# Patient Record
Sex: Male | Born: 1990 | Race: Black or African American | Hispanic: No | Marital: Single | Smoking: Never smoker
Health system: Southern US, Community
[De-identification: ages and names within clinical notes are randomized; demographics above are authoritative.]

## PROBLEM LIST (undated history)

## (undated) DIAGNOSIS — T7840XA Allergy, unspecified, initial encounter: Secondary | ICD-10-CM

## (undated) DIAGNOSIS — D649 Anemia, unspecified: Secondary | ICD-10-CM

## (undated) HISTORY — PX: EYE SURGERY: SHX253

## (undated) HISTORY — DX: Anemia, unspecified: D64.9

## (undated) HISTORY — DX: Allergy, unspecified, initial encounter: T78.40XA

---
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
## 2009-01-25 ENCOUNTER — Emergency Department (HOSPITAL_COMMUNITY): Admission: EM | Admit: 2009-01-25 | Discharge: 2009-01-25 | Payer: Self-pay | Admitting: Emergency Medicine

## 2009-05-19 ENCOUNTER — Emergency Department (HOSPITAL_COMMUNITY): Admission: EM | Admit: 2009-05-19 | Discharge: 2009-05-19 | Payer: Self-pay | Admitting: Emergency Medicine

## 2011-11-15 ENCOUNTER — Encounter: Payer: Self-pay | Admitting: Emergency Medicine

## 2011-11-15 ENCOUNTER — Emergency Department (HOSPITAL_COMMUNITY): Payer: PRIVATE HEALTH INSURANCE

## 2011-11-15 ENCOUNTER — Emergency Department (HOSPITAL_COMMUNITY)
Admission: EM | Admit: 2011-11-15 | Discharge: 2011-11-15 | Disposition: A | Discharge status: Home / Self Care | Payer: PRIVATE HEALTH INSURANCE | Attending: Emergency Medicine | Admitting: Emergency Medicine

## 2011-11-15 DIAGNOSIS — S63602A Unspecified sprain of left thumb, initial encounter: Secondary | ICD-10-CM

## 2011-11-15 DIAGNOSIS — M79609 Pain in unspecified limb: Secondary | ICD-10-CM | POA: Insufficient documentation

## 2011-11-15 DIAGNOSIS — IMO0002 Reserved for concepts with insufficient information to code with codable children: Secondary | ICD-10-CM | POA: Insufficient documentation

## 2011-11-15 DIAGNOSIS — S6390XA Sprain of unspecified part of unspecified wrist and hand, initial encounter: Secondary | ICD-10-CM | POA: Insufficient documentation

## 2011-11-15 MED ORDER — IBUPROFEN 800 MG PO TABS: 800.0000 mg | ORAL_TABLET | Freq: Three times a day (TID) | ORAL | Status: AC | PRN

## 2011-11-15 NOTE — ED Notes (Signed)
Ortho tech at bedside 

## 2011-11-15 NOTE — ED Provider Notes (Signed)
History     CSN: [id number] Arrival date & time: 11/15/2011 12:47 PM   First MD Initiated Contact with Patient 11/15/11 1457      Chief Complaint  Patient presents with  . Hand Pain  . Joint Swelling    (Consider location/radiation/quality/duration/timing/severity/associated sxs/prior treatment) HPI Comments: L thumb bent backwards by another person one hour PTA. Pt complains of pain, swelling at base of thumb, and difficulty bending thumb. Pt took 400mg  ibuprofen PTA. Denies other injury.   Patient is a 20 y.o. male presenting with hand pain. The history is provided by the patient.  Hand Pain This is a new problem. The current episode started today. Associated symptoms include joint swelling. Pertinent negatives include no arthralgias, neck pain, numbness or weakness. The symptoms are aggravated by bending. He has tried NSAIDs for the symptoms. The treatment provided mild relief.    History reviewed. No pertinent past medical history.  History reviewed. No pertinent past surgical history.  No family history on file.  History  Substance Use Topics  . Smoking status: Never Smoker   . Smokeless tobacco: Not on file  . Alcohol Use: No      Review of Systems  Constitutional: Negative for activity change.  HENT: Negative for neck pain.   Musculoskeletal: Positive for joint swelling. Negative for back pain and arthralgias.  Skin: Negative for wound.  Neurological: Negative for weakness and numbness.    Allergies  Shrimp  Home Medications   Current Outpatient Rx  Name Route Sig Dispense Refill  . IBUPROFEN 200 MG PO TABS Oral Take 400 mg by mouth every 6 (six) hours as needed. pain       BP 111/67  Pulse 85  Temp(Src) 98.3 F (36.8 C) (Oral)  Resp 16  SpO2 98%  Physical Exam  Nursing note and vitals reviewed. Constitutional: He is oriented to person, place, and time. He appears well-developed and well-nourished.  HENT:  Head: Normocephalic and atraumatic.    Eyes: Conjunctivae are normal.  Neck: Normal range of motion. Neck supple.  Cardiovascular: Normal rate.   Musculoskeletal: He exhibits tenderness. He exhibits no edema.       Tenderness to palp over flexor aspect of L thumb. Decreased ROM with flexion and opposition of L thumb. No swelling noted. Cap refill < 2s. No wrist or elbow pain.   Neurological: He is alert and oriented to person, place, and time.  Skin: Skin is warm and dry.  Psychiatric: He has a normal mood and affect.    ED Course  Procedures (including critical care time)  Labs Reviewed - No data to display Dg Hand Complete Left  11/15/2011  *RADIOLOGY REPORT*  Clinical Data: Injury and pain.  LEFT HAND - COMPLETE 3+ VIEW  Comparison: None.  Findings: Imaged bones, joints and soft tissues appear normal.  IMPRESSION: Negative study.  Original Report Authenticated By: Bernadene Bell. D'ALESSIO, M.D.     1. Sprain of left thumb     3:07 PM Pt seen and examined. X-ray ordered.   3:28 PM X-ray reviewed by myself. Pt informed. Finger splint by ortho tech. Pt counseled on RICE. Ortho f/u given if no improvement. Pt verbalizes understanding and agrees with plan.   MDM  Thumb sprain.         Eustace Moore [place], [place] 11/15/11 [phone]

## 2011-11-15 NOTE — ED Provider Notes (Signed)
Medical screening examination/treatment/procedure(s) were performed by non-physician practitioner and as supervising physician I was immediately available for consultation/collaboration.   Forbes Cellar, MD 11/15/11 1538

## 2011-11-15 NOTE — ED Notes (Signed)
Pt states he had finger bent back accidentally, heard a pop and had immediate pain.

## 2011-11-15 NOTE — ED Notes (Signed)
Pt c/o left thumb pain beginning 20 mins pta with swelling; states "My girlfriend grabbed it and I felt a pop and it immediately began hurting and swelling. I cant hardly move it." No obvious deformity noted.

## 2011-11-15 NOTE — ED Notes (Signed)
Awaiting ortho arrival for splint application.

## 2015-03-21 ENCOUNTER — Encounter (HOSPITAL_COMMUNITY): Payer: Self-pay | Admitting: *Deleted

## 2015-03-21 ENCOUNTER — Emergency Department (HOSPITAL_COMMUNITY)
Admission: EM | Admit: 2015-03-21 | Discharge: 2015-03-21 | Disposition: A | Discharge status: Home / Self Care | Payer: BLUE CROSS/BLUE SHIELD | Attending: Emergency Medicine | Admitting: Emergency Medicine

## 2015-03-21 DIAGNOSIS — R000 Tachycardia, unspecified: Secondary | ICD-10-CM | POA: Diagnosis not present

## 2015-03-21 DIAGNOSIS — J020 Streptococcal pharyngitis: Secondary | ICD-10-CM | POA: Diagnosis not present

## 2015-03-21 DIAGNOSIS — R509 Fever, unspecified: Secondary | ICD-10-CM | POA: Diagnosis present

## 2015-03-21 LAB — URINALYSIS, ROUTINE W REFLEX MICROSCOPIC
Glucose, UA: NEGATIVE mg/dL
Hgb urine dipstick: NEGATIVE
Ketones, ur: 40 mg/dL — AB
Leukocytes, UA: NEGATIVE
Nitrite: NEGATIVE
Protein, ur: 30 mg/dL — AB
Specific Gravity, Urine: 1.031 — ABNORMAL HIGH (ref 1.005–1.030)
Urobilinogen, UA: 2 mg/dL — ABNORMAL HIGH (ref 0.0–1.0)
pH: 6 (ref 5.0–8.0)

## 2015-03-21 LAB — CBC WITH DIFFERENTIAL/PLATELET
Basophils Absolute: 0 10*3/uL (ref 0.0–0.1)
Basophils Relative: 0 % (ref 0–1)
Eosinophils Absolute: 0 10*3/uL (ref 0.0–0.7)
Eosinophils Relative: 0 % (ref 0–5)
HCT: 41.5 % (ref 39.0–52.0)
Hemoglobin: 13.6 g/dL (ref 13.0–17.0)
Lymphocytes Relative: 9 % — ABNORMAL LOW (ref 12–46)
Lymphs Abs: 1.2 10*3/uL (ref 0.7–4.0)
MCH: 23.7 pg — ABNORMAL LOW (ref 26.0–34.0)
MCHC: 32.8 g/dL (ref 30.0–36.0)
MCV: 72.4 fL — ABNORMAL LOW (ref 78.0–100.0)
Monocytes Absolute: 1.1 10*3/uL — ABNORMAL HIGH (ref 0.1–1.0)
Monocytes Relative: 8 % (ref 3–12)
Neutro Abs: 11.4 10*3/uL — ABNORMAL HIGH (ref 1.7–7.7)
Neutrophils Relative %: 83 % — ABNORMAL HIGH (ref 43–77)
Platelets: 200 10*3/uL (ref 150–400)
RBC: 5.73 MIL/uL (ref 4.22–5.81)
RDW: 13.9 % (ref 11.5–15.5)
WBC: 13.7 10*3/uL — ABNORMAL HIGH (ref 4.0–10.5)

## 2015-03-21 LAB — URINE MICROSCOPIC-ADD ON

## 2015-03-21 LAB — COMPREHENSIVE METABOLIC PANEL
ALT: 24 U/L (ref 0–53)
AST: 24 U/L (ref 0–37)
Albumin: 3.5 g/dL (ref 3.5–5.2)
Alkaline Phosphatase: 103 U/L (ref 39–117)
Anion gap: 12 (ref 5–15)
BUN: 8 mg/dL (ref 6–23)
CO2: 21 mmol/L (ref 19–32)
Calcium: 9.1 mg/dL (ref 8.4–10.5)
Chloride: 99 mmol/L (ref 96–112)
Creatinine, Ser: 1.33 mg/dL (ref 0.50–1.35)
GFR calc Af Amer: 85 mL/min — ABNORMAL LOW (ref >90)
GFR calc non Af Amer: 74 mL/min — ABNORMAL LOW (ref >90)
Glucose, Bld: 114 mg/dL — ABNORMAL HIGH (ref 70–99)
Potassium: 3.5 mmol/L (ref 3.5–5.1)
Sodium: 132 mmol/L — ABNORMAL LOW (ref 135–145)
Total Bilirubin: 0.7 mg/dL (ref 0.3–1.2)
Total Protein: 7.3 g/dL (ref 6.0–8.3)

## 2015-03-21 LAB — I-STAT CG4 LACTIC ACID, ED: Lactic Acid, Venous: 0.9 mmol/L (ref 0.5–2.0)

## 2015-03-21 MED ORDER — ACETAMINOPHEN 325 MG PO TABS
ORAL_TABLET | ORAL | Status: AC
  Filled 2015-03-21: qty 2

## 2015-03-21 MED ORDER — SODIUM CHLORIDE 0.9 % IV BOLUS (SEPSIS)
2000.0000 mL | INTRAVENOUS | Status: AC
  Administered 2015-03-21: 2000 mL via INTRAVENOUS

## 2015-03-21 MED ORDER — PREDNISONE 20 MG PO TABS: 40.0000 mg | ORAL_TABLET | Freq: Every day | ORAL | Status: DC

## 2015-03-21 MED ORDER — ACETAMINOPHEN 325 MG PO TABS
650.0000 mg | ORAL_TABLET | Freq: Four times a day (QID) | ORAL | Status: DC | PRN
  Administered 2015-03-21: 650 mg via ORAL

## 2015-03-21 MED ORDER — ONDANSETRON HCL 4 MG/2ML IJ SOLN
4.0000 mg | INTRAMUSCULAR | Status: AC
  Administered 2015-03-21: 4 mg via INTRAVENOUS
  Filled 2015-03-21: qty 2

## 2015-03-21 MED ORDER — PENICILLIN G BENZATHINE 1200000 UNIT/2ML IM SUSP
1.2000 10*6.[IU] | Freq: Once | INTRAMUSCULAR | Status: AC
  Administered 2015-03-21: 1.2 10*6.[IU] via INTRAMUSCULAR
  Filled 2015-03-21: qty 2

## 2015-03-21 MED ORDER — KETOROLAC TROMETHAMINE 30 MG/ML IJ SOLN
30.0000 mg | Freq: Once | INTRAMUSCULAR | Status: AC
  Administered 2015-03-21: 30 mg via INTRAVENOUS
  Filled 2015-03-21: qty 1

## 2015-03-21 MED ORDER — HYDROCODONE-ACETAMINOPHEN 5-325 MG PO TABS: 2.0000 | ORAL_TABLET | ORAL | Status: DC | PRN

## 2015-03-21 MED ORDER — DEXAMETHASONE SODIUM PHOSPHATE 4 MG/ML IJ SOLN
4.0000 mg | Freq: Once | INTRAMUSCULAR | Status: AC
  Administered 2015-03-21: 4 mg via INTRAVENOUS
  Filled 2015-03-21: qty 1

## 2015-03-21 NOTE — ED Notes (Signed)
Pt c/o chills, hot flashes, headache, teeth pain, ear pain, fever and sore and swollen throat x1 week. States that he has taken tylenol with little relief.

## 2015-03-21 NOTE — Discharge Instructions (Signed)
Please follow directions provided. Use the referral or the resource guide to establish care with a primary care doctor for follow-up. Be sure to drink plenty of fluids by mouth to stay well hydrated. Please take Tylenol or the Vicodin every 4 hours to help with pain and fever. Please take the steroids daily for 5 days to help with swelling. Don't hesitate to return for any new, worsening, or concerning symptoms.  SEEK IMMEDIATE MEDICAL CARE IF:  You develop any new symptoms such as vomiting, severe headache, stiff or painful neck, chest pain, shortness of breath, or trouble swallowing.  You develop severe throat pain, drooling, or changes in your voice.  You develop swelling of the neck, or the skin on the neck becomes red and tender.  You develop signs of dehydration, such as fatigue, dry mouth, and decreased urination.  You become increasingly sleepy, or you cannot wake up completely.    Emergency Department Resource Guide 1) Find a Doctor and Pay Out of Pocket Although you won't have to find out who is covered by your insurance plan, it is a good idea to ask around and get recommendations. You will then need to call the office and see if the doctor you have chosen will accept you as a new patient and what types of options they offer for patients who are self-pay. Some doctors offer discounts or will set up payment plans for their patients who do not have insurance, but you will need to ask so you aren't surprised when you get to your appointment.  2) Contact Your Local Health Department Not all health departments have doctors that can see patients for sick visits, but many do, so it is worth a call to see if yours does. If you don't know where your local health department is, you can check in your phone book. The CDC also has a tool to help you locate your state's health department, and many state websites also have listings of all of their local health departments.  3) Find a Walk-in Clinic If  your illness is not likely to be very severe or complicated, you may want to try a walk in clinic. These are popping up all over the country in pharmacies, drugstores, and shopping centers. They're usually staffed by nurse practitioners or physician assistants that have been trained to treat common illnesses and complaints. They're usually fairly quick and inexpensive. However, if you have serious medical issues or chronic medical problems, these are probably not your best option.  No Primary Care Doctor: - Call Health Connect at  [phone] - they can help you locate a primary care doctor that  accepts your insurance, provides certain services, etc. - Physician Referral Service- [phone]  Chronic Pain Problems: Organization         Address  Phone   Notes  [facility]  [phone] Patients need to be referred by their primary care doctor.   Medication Assistance: Organization         Address  Phone   Notes  [facility] Medication [facility] [address] [place] [place], [place] [address] [phone] --Must be a resident of [facility] -- Must have NO insurance coverage whatsoever (no Medicaid/ Medicare, etc.) -- The pt. MUST have a primary care doctor that directs their care regularly and follows them in the community   MedAssist  [phone]   [employer]  [phone]    Agencies that provide inexpensive medical care: Organization  Address  Phone   Notes  [facility]  [phone]   [facility]    [phone]   [facility] [address] [place], [place] [address] [phone]   [facility] [address], [place] [phone]   Planned Parenthood    [phone]   [facility]    [phone]   [facility]  [address], [place] Phone:  [phone], Fax:  [phone] Hours of  Operation:  9 am - 6 pm, M-F.  Also accepts Medicaid/Medicare and self-pay.  [facility]  [address], [place] Phone: [phone], Fax: [phone]. Hours of Operation:  8:30 am - 5:30 pm, M-F.  Also accepts Medicaid and self-pay.  [facility] [place] [address], [place] Phone: [phone]   [facility] [address] Natasha Bence [place], [place] [phone], Ext. [phone] Mondays & Thursdays: 7-9 AM.  First 15 patients are seen on a first come, first serve basis.    Medicaid-accepting [facility] Providers:  Organization         Address  Phone   Notes  [facility] [address], [place] [phone] Also accepts self-pay patients.  [facility] [address] Laurell Josephs [place], [place]  [phone]   [facility] [address], [place] [phone]   [facility] [address] [place] [phone]   Renaye Rakers [address], [place]   [phone] Only accepts [place] patients after they have their name applied to their card.   Self-Pay (no insurance) in [facility]:  Organization         Address  Phone   Notes  Sickle Cell Patients, [facility] [address] [place], [place] [phone]   [facility] [address] [place], [place] [phone]   [facility] [place]  [address], [place] [phone]   [facility]/Dr. Osei-Bonsu  [address], [place] or [address], [place] [phone] Phone number for both [place] and [place] locations is the same.  Urgent Medical and [facility] [address] [place] [phone]   [facility] [address] [place] or [address] [phone]   [facility] [address] [place] [phone], phone; [phone], fax Sees patients 1st and 3rd Saturday of every month.  Must not qualify for public or private insurance (i.e. Medicaid, Medicare, [place] Health Choice, Veterans' Benefits)  Household income should be no more than 200% of the poverty level The clinic cannot treat you if you are pregnant or think you are pregnant  Sexually transmitted diseases are not treated at the clinic.    Dental Care: Organization         Address  Phone  Notes  [facility] Department [facility] [address] [place], [place] [phone] Accepts children up to age 20 who are enrolled in [place] or [place] Health Choice; pregnant women with a Medicaid card; and children who have applied for Medicaid or [place] Health Choice, but were declined, whose parents can pay a reduced fee at time  of service.  [facility] [place]  [address], [place] [phone] Accepts children up to age 25 who are enrolled in [place] or [place] Health Choice; pregnant women with a Medicaid card; and children who have applied for Medicaid or [place] Health Choice, but were declined, whose parents can pay a reduced fee at time of service.  [facility]  [address], [place] [phone] Patients are seen by appointment only. Walk-ins are not accepted. [facility] will see patients 25 years of age and older. Monday - Tuesday (8am-5pm) Most Wednesdays (8:30-5pm) $30 per visit, cash only  [facility]  [address], [facility] [phone] Patients are seen by appointment only. Walk-ins are not accepted. [facility] will see patients 25 years of age and older. One Wednesday Evening (Monthly: Volunteer Based).  $30 per visit, cash only  [facility]  [phone]) [phone] for adults; Children under age 324, call Graduate Pediatric  Dentistry at [phone]) [phone]. Children aged 404-14, please call [phone]) [phone] to request a pediatric application.  Dental services are provided in all areas of dental care including fillings, crowns and bridges, complete and partial dentures, implants, gum treatment, root canals, and extractions. Preventive care is also provided. Treatment is provided to both adults and children. Patients are selected via a lottery and there is often a waiting list.   [facility] [address], [place]  [phone] [email or web address]   [facility] [address], [place], [place] [phone], Ext. [phone] Second and Fourth Thursday of each month, opens at 6:30 AM; Clinic ends at 9 AM.  Patients are seen on a first-come first-served basis, and a limited number are seen during each clinic.   [facility]  [address], [place], [place] [phone]) [phone]   Eligibility Requirements You must have lived in [place], [place], or [place] for at least the last three months.   You cannot be eligible for state or federal sponsored National Cityhealthcare insurance, including CIGNAVeterans Administration, [place]icaid, or [employer]icare.   You generally cannot be eligible for healthcare insurance through your employer.    How to apply: Eligibility screenings are held every Tuesday and Wednesday afternoon from 1:00 pm until 4:00 pm. You do not need an appointment for the interview!  [facility] [address], [place], [place] [phone]   [facility]  [phone]   [facility]  [phone]   [facility]  [phone]    Behavioral Health Resources in the Community: Intensive Outpatient Programs Organization         Address  Phone  Notes  [facility] [address], [place], [place] [phone]   [facility] [address], [place], [place] [phone]   ADS:  [facility] [address], [place], [place]  [phone]   [facility] [address],  [place], [place] [phone] or [phone]   Substance Abuse Resources Organization         Address  Phone  Notes  [facility]  [phone]   [facility]  [phone]   [facility]  [phone]   [facility]  [phone]   Residential & Outpatient Substance Abuse Program  [phone]   Psychological Services Organization         Address  Phone  Notes  [employer] [facility]  [phone]   [facility]  [phone]   [facility] [address], [place] [phone] or [phone]    Mobile Crisis Teams Organization         Address  Phone  Notes  [facility], Mobile Crisis Care Unit  [phone]   [facility]  [address] [place], [place] [phone]   Doristine Locks [address] [place] [phone]    Self-Help/Support Groups Organization         Address  Phone             Notes  [facility] [place] - variety of support groups  336- I7437963 Call for more information  Narcotics Anonymous (NA), [facility] [address], [employer] [place]  2 meetings at this location   Statistician         Address  Phone  Notes  [facility] [address],    [place]  [phone]   [facility]  [address] [place] [address], [place], [place] [phone]   [facility] [address] [place], [place] [phone] Admissions: 8am-3pm M-F  [facility] [address]    [place], [place] [phone]   [facility] [address] [place], [place], [place] [phone]   [facility] [address]  [place], [place] [phone]   [facility] - Intensive Outpatient [address]ure[address],  [place], [place] [phone]   [facility] (Addiction Recovery [facility] [address] [place]  [place], [place] [phone] or 6144387887   [facility] ([facility]) [address], [place], [place] [phone] Accepts Medicaid  [facility] [address],  [place] [phone] Substance Abuse/Addiction Treatment   [facility] Organization         Address  Phone  Notes  [facility]  [phone]   Angie Fava, PhD [address] Ervin Knack [place], [place]   [phone] or [phone]   [facility]al   [address] [place], [place] [phone]   [facility] [address] [place], [place] [phone] Insurance/Medicaid/sponsorship through [facility] and [facility] [address]                                    [place], [place] [phone] Therapy/tele-psych/case  [facility] [address], [place] [phone]    Dr. Lolly Mustache  [phone]   [facility] [place]  [employer] [facility] Dept. 1) [address], [place] 2) [address], [place] 3)  [address], [place] [phone]  [phone]   [facility] Child Abuse Hotline 312-282-8009 or [phone] (After Hours)

## 2015-03-21 NOTE — ED Notes (Signed)
Patient was able to drink fluids.

## 2015-03-21 NOTE — ED Notes (Signed)
pts tonsils appear swollen with white patches

## 2015-03-21 NOTE — ED Provider Notes (Signed)
CSN: [id number]     Arrival date & time 03/21/15  2000 History   First MD Initiated Contact with Patient 03/21/15 2017     Chief Complaint  Patient presents with  . Oral Swelling  . Fever   (Consider location/radiation/quality/duration/timing/severity/associated sxs/prior Treatment) HPI  Randall Banks is a 24 yo male presenting with chills and subjective fever x 1 week.  He began having sore throat and swollen glands in his neck 2 days ago.  He has also had associated nausea and last night felt like it was harder to swallow.  His wife measured his temperature last night and it 102.9 orally  He currently rates his pain as 9/10.  He denies chest pain, shortness of breath, vomiting, diarrhea or rash.   History reviewed. No pertinent past medical history. History reviewed. No pertinent past surgical history. No family history on file. History  Substance Use Topics  . Smoking status: Never Smoker   . Smokeless tobacco: Not on file  . Alcohol Use: Yes     Comment: weekends    Review of Systems  Constitutional: Positive for fever and chills.  HENT: Positive for sore throat, trouble swallowing and voice change.   Eyes: Negative for visual disturbance.  Respiratory: Negative for cough and shortness of breath.   Cardiovascular: Negative for chest pain and leg swelling.  Gastrointestinal: Positive for nausea. Negative for vomiting and diarrhea.  Genitourinary: Negative for dysuria.  Musculoskeletal: Positive for myalgias.  Skin: Negative for rash.  Neurological: Negative for weakness, numbness and headaches.      Allergies  Shrimp  Home Medications   Prior to Admission medications   Medication Sig Start Date End Date Taking? Authorizing Provider  ibuprofen (ADVIL,MOTRIN) 200 MG tablet Take 400 mg by mouth every 6 (six) hours as needed. pain     Historical Provider, MD   BP 130/72 mmHg  Pulse 130  Temp(Src) 103 F (39.4 C) (Oral)  Resp 18  Ht  (1.854 m)  Wt 222 lb  (100.699 kg)  BMI 29.30 kg/m2  SpO2 97% Physical Exam  Constitutional: He appears well-developed and well-nourished. No distress.  HENT:  Head: Normocephalic and atraumatic.  Mouth/Throat: No trismus in the jaw. No uvula swelling. Oropharyngeal exudate, posterior oropharyngeal edema and posterior oropharyngeal erythema present. No tonsillar abscesses.  Large bilaterally swollen tonsils, erythematous with exudate, but no abscess noted  Eyes: Conjunctivae are normal. Pupils are equal, round, and reactive to light.  Neck: Normal range of motion. Neck supple.  Cardiovascular: Regular rhythm and intact distal pulses.  Tachycardia present.   Pulmonary/Chest: Effort normal and breath sounds normal. No respiratory distress. He has no decreased breath sounds. He has no wheezes. He has no rhonchi. He has no rales. He exhibits no tenderness.  Abdominal: Soft. There is no tenderness.  Musculoskeletal: He exhibits no tenderness.  Lymphadenopathy:       Head (right side): Tonsillar adenopathy present.       Head (left side): Tonsillar adenopathy present.    He has cervical adenopathy.  Neurological: He is alert.  Skin: Skin is warm and dry. No rash noted. He is not diaphoretic.  Psychiatric: He has a normal mood and affect.  Nursing note and vitals reviewed.   ED Course  Procedures (including critical care time) Labs Review Labs Reviewed  CBC WITH DIFFERENTIAL/PLATELET - Abnormal; Notable for the following:    WBC 13.7 (*)    MCV 72.4 (*)    MCH 23.7 (*)    Neutrophils Relative %  83 (*)    Lymphocytes Relative 9 (*)    Neutro Abs 11.4 (*)    Monocytes Absolute 1.1 (*)    All other components within normal limits  COMPREHENSIVE METABOLIC PANEL - Abnormal; Notable for the following:    Sodium 132 (*)    Glucose, Bld 114 (*)    GFR calc non Af Amer 74 (*)    GFR calc Af Amer 85 (*)    All other components within normal limits  URINALYSIS, ROUTINE W REFLEX MICROSCOPIC - Abnormal; Notable  for the following:    Color, Urine AMBER (*)    Specific Gravity, Urine 1.031 (*)    Bilirubin Urine SMALL (*)    Ketones, ur 40 (*)    Protein, ur 30 (*)    Urobilinogen, UA 2.0 (*)    All other components within normal limits  URINE MICROSCOPIC-ADD ON - Abnormal; Notable for the following:    Squamous Epithelial / LPF FEW (*)    Bacteria, UA FEW (*)    All other components within normal limits  CULTURE, BLOOD (ROUTINE X 2)  CULTURE, BLOOD (ROUTINE X 2)  URINE CULTURE  I-STAT CG4 LACTIC ACID, ED    Imaging Review No results found.   EKG Interpretation None      MDM   Final diagnoses:  Strep pharyngitis   24 yo with fever, tonsillar and cervical lymphadenopathy, difficulty swallowing, and tonsillar exudate and edema but presentation non concerning for PTA or infxn spread to soft tissue. No trismus or uvula deviation. He was treated in the ED with  IVF, steroids, tylenol and PCN IM.  His symptoms, tachycardia and difficulty swallowing resolved after treatment. Discussed importance of water rehydration.  Specific return precautions discussed. Pt able to drink water in ED without difficulty with intact air way. Pt is well-appearing, in no acute distress and vital signs reviewed and not concerning. He appears safe to be discharged.  Discharge include follow-up with their PCP.  Return precautions provided. Pt aware of plan and in agreement.    Filed Vitals:   03/21/15 2100 03/21/15 2130 03/21/15 2200 03/21/15 2214  BP: 122/65 120/59 128/57 128/57  Pulse: 100 93 89 93  Temp:    100.3 F (37.9 C)  TempSrc:    Oral  Resp: 23 18 24 23   Height:      Weight:      SpO2: 95% 96% 97% 97%    Meds given in ED:  Medications  sodium chloride 0.9 % bolus 2,000 mL (2,000 mLs Intravenous New Bag/Given 03/21/15 2041)  ketorolac (TORADOL) 30 MG/ML injection 30 mg (30 mg Intravenous Given 03/21/15 2046)  dexamethasone (DECADRON) injection 4 mg (4 mg Intravenous Given 03/21/15 2047)   ondansetron (ZOFRAN) injection 4 mg (4 mg Intravenous Given 03/21/15 2050)  penicillin g benzathine (BICILLIN LA) 1200000 UNIT/2ML injection 1.2 Million Units (1.2 Million Units Intramuscular Given 03/21/15 2051)    Discharge Medication List as of 03/21/2015  9:57 PM    START taking these medications   Details  HYDROcodone-acetaminophen (NORCO/VICODIN) 5-325 MG per tablet Take 2 tablets by mouth every 4 (four) hours as needed., Starting 03/21/2015, Until Discontinued, Print    predniSONE (DELTASONE) 20 MG tablet Take 2 tablets (40 mg total) by mouth daily., Starting 03/21/2015, Until Discontinued, Print          Harle BattiestElizabeth Kaidence Callaway, NP 03/22/15 [id number]  Rolland PorterMark James, MD 04/03/15 [phone]

## 2015-03-23 LAB — URINE CULTURE: Colony Count: 15000

## 2015-03-28 LAB — CULTURE, BLOOD (ROUTINE X 2)
Culture: NO GROWTH
Culture: NO GROWTH

## 2015-07-21 ENCOUNTER — Encounter (HOSPITAL_COMMUNITY): Payer: Self-pay

## 2015-07-21 ENCOUNTER — Emergency Department (HOSPITAL_COMMUNITY): Payer: BLUE CROSS/BLUE SHIELD

## 2015-07-21 ENCOUNTER — Emergency Department (HOSPITAL_COMMUNITY)
Admission: EM | Admit: 2015-07-21 | Discharge: 2015-07-22 | Disposition: A | Discharge status: Home / Self Care | Payer: Self-pay | Attending: Emergency Medicine | Admitting: Emergency Medicine

## 2015-07-21 DIAGNOSIS — R05 Cough: Secondary | ICD-10-CM | POA: Insufficient documentation

## 2015-07-21 DIAGNOSIS — R059 Cough, unspecified: Secondary | ICD-10-CM

## 2015-07-21 DIAGNOSIS — R197 Diarrhea, unspecified: Secondary | ICD-10-CM | POA: Insufficient documentation

## 2015-07-21 LAB — CBC
HEMATOCRIT: 38.3 % — AB (ref 39.0–52.0)
Hemoglobin: 12.7 g/dL — ABNORMAL LOW (ref 13.0–17.0)
MCH: 23.9 pg — AB (ref 26.0–34.0)
MCHC: 33.2 g/dL (ref 30.0–36.0)
MCV: 72.1 fL — AB (ref 78.0–100.0)
Platelets: 186 10*3/uL (ref 150–400)
RBC: 5.31 MIL/uL (ref 4.22–5.81)
RDW: 14.5 % (ref 11.5–15.5)
WBC: 8.3 10*3/uL (ref 4.0–10.5)

## 2015-07-21 LAB — COMPREHENSIVE METABOLIC PANEL
ALT: 24 U/L (ref 17–63)
AST: 30 U/L (ref 15–41)
Albumin: 3.6 g/dL (ref 3.5–5.0)
Alkaline Phosphatase: 89 U/L (ref 38–126)
Anion gap: 8 (ref 5–15)
BUN: 11 mg/dL (ref 6–20)
CHLORIDE: 104 mmol/L (ref 101–111)
CO2: 24 mmol/L (ref 22–32)
Calcium: 9.2 mg/dL (ref 8.9–10.3)
Creatinine, Ser: 1.21 mg/dL (ref 0.61–1.24)
GFR calc Af Amer: >60 mL/min (ref >60)
Glucose, Bld: 110 mg/dL — ABNORMAL HIGH (ref 65–99)
POTASSIUM: 3.6 mmol/L (ref 3.5–5.1)
SODIUM: 136 mmol/L (ref 135–145)
Total Bilirubin: 0.7 mg/dL (ref 0.3–1.2)
Total Protein: 6.9 g/dL (ref 6.5–8.1)

## 2015-07-21 LAB — LIPASE, BLOOD: LIPASE: 36 U/L (ref 22–51)

## 2015-07-21 LAB — URINALYSIS, ROUTINE W REFLEX MICROSCOPIC
GLUCOSE, UA: NEGATIVE mg/dL
Hgb urine dipstick: NEGATIVE
Ketones, ur: 15 mg/dL — AB
LEUKOCYTES UA: NEGATIVE
NITRITE: NEGATIVE
PH: 6 (ref 5.0–8.0)
Protein, ur: NEGATIVE mg/dL
SPECIFIC GRAVITY, URINE: 1.039 — AB (ref 1.005–1.030)
Urobilinogen, UA: 2 mg/dL — ABNORMAL HIGH (ref 0.0–1.0)

## 2015-07-21 MED ORDER — LOPERAMIDE HCL 2 MG PO CAPS
4.0000 mg | ORAL_CAPSULE | Freq: Once | ORAL | Status: AC
  Administered 2015-07-21: 4 mg via ORAL
  Filled 2015-07-21: qty 2

## 2015-07-21 NOTE — ED Notes (Signed)
Pt had a fever for 2-3 days and it is gone now. Yesterday started having diarrhea. No abd pain, nausea or vomiting. Reports a cough with yellow sputum and it is getting worse.

## 2015-07-22 MED ORDER — LOPERAMIDE HCL 2 MG PO CAPS: 2.0000 mg | ORAL_CAPSULE | Freq: Four times a day (QID) | ORAL | Status: DC | PRN

## 2015-07-22 NOTE — Discharge Instructions (Signed)
Take the prescribed medication as directed. °Make sure to drink plenty of fluids to stay hydrated. °Return to the ED for new or worsening symptoms. ° °

## 2015-07-22 NOTE — ED Provider Notes (Signed)
CSN: [id number]     Arrival date & time 07/21/15  2139 History   First MD Initiated Contact with Patient 07/21/15 2204     Chief Complaint  Patient presents with  . Diarrhea  . Cough     (Consider location/radiation/quality/duration/timing/severity/associated sxs/prior Treatment) Patient is a 24 y.o. male presenting with diarrhea and cough. The history is provided by the patient and medical records.  Diarrhea Cough   This is a 24 year old male with no significant past medical history presenting to the ED for cough and diarrhea. Patient states 3 days ago he felt that he had a fever, however did not actually check his temperature.  He states that has since resolved, however has developed a productive cough with yellow sputum as well as watery diarrhea.  He denies any chest pain or shortness of breath. No abdominal pain, nausea, or vomiting. He has continued eating and drinking normally. Patient denies any known sick contacts. No intervention tried prior to arrival. Vital signs stable.  History reviewed. No pertinent past medical history. History reviewed. No pertinent past surgical history. No family history on file. Social History  Substance Use Topics  . Smoking status: Never Smoker   . Smokeless tobacco: None  . Alcohol Use: Yes     Comment: weekends    Review of Systems  Respiratory: Positive for cough.   Gastrointestinal: Positive for diarrhea.  All other systems reviewed and are negative.     Allergies  Shrimp  Home Medications   Prior to Admission medications   Medication Sig Start Date End Date Taking? Authorizing Provider  guaiFENesin (MUCINEX) 600 MG 12 hr tablet Take 600 mg by mouth daily as needed for cough.   Yes Historical Provider, MD  ibuprofen (ADVIL,MOTRIN) 200 MG tablet Take 400 mg by mouth every 6 (six) hours as needed. pain    Yes Historical Provider, MD   BP 126/74 mmHg  Pulse 88  Temp(Src) 99 F (37.2 C) (Oral)  Resp 18  SpO2 100%   Physical  Exam  Constitutional: He is oriented to person, place, and time. He appears well-developed and well-nourished.  HENT:  Head: Normocephalic and atraumatic.  Right Ear: Tympanic membrane and ear canal normal.  Left Ear: Tympanic membrane and ear canal normal.  Nose: Nose normal.  Mouth/Throat: Uvula is midline, oropharynx is clear and moist and mucous membranes are normal. No oropharyngeal exudate, posterior oropharyngeal edema, posterior oropharyngeal erythema or tonsillar abscesses.  Eyes: Conjunctivae and EOM are normal. Pupils are equal, round, and reactive to light.  Neck: Normal range of motion.  Cardiovascular: Normal rate, regular rhythm and normal heart sounds.   Pulmonary/Chest: Effort normal and breath sounds normal. He has no wheezes. He has no rhonchi.  Respirations unlabored, lungs clear bilaterally, dry cough noted on exam  Abdominal: Soft. Bowel sounds are normal. There is no tenderness. There is no rigidity, no guarding and no CVA tenderness.  Abdomen soft, nondistended, no focal tenderness or peritoneal signs  Musculoskeletal: Normal range of motion.  Neurological: He is alert and oriented to person, place, and time.  Skin: Skin is warm and dry.  Psychiatric: He has a normal mood and affect.  Nursing note and vitals reviewed.   ED Course  Procedures (including critical care time) Labs Review Labs Reviewed  COMPREHENSIVE METABOLIC PANEL - Abnormal; Notable for the following:    Glucose, Bld 110 (*)    All other components within normal limits  CBC - Abnormal; Notable for the following:    Hemoglobin  12.7 (*)    HCT 38.3 (*)    MCV 72.1 (*)    MCH 23.9 (*)    All other components within normal limits  URINALYSIS, ROUTINE W REFLEX MICROSCOPIC (NOT AT [facility]) - Abnormal; Notable for the following:    Color, Urine AMBER (*)    Specific Gravity, Urine 1.039 (*)    Bilirubin Urine SMALL (*)    Ketones, ur 15 (*)    Urobilinogen, UA 2.0 (*)    All other components  within normal limits  LIPASE, BLOOD    Imaging Review Dg Chest 2 View  07/21/2015   CLINICAL DATA:  Cough, fever, and congestion for couple of days.  EXAM: CHEST  2 VIEW  COMPARISON:  None.  FINDINGS: The heart size and mediastinal contours are within normal limits. Both lungs are clear. The visualized skeletal structures are unremarkable.  IMPRESSION: No active cardiopulmonary disease.   Electronically Signed   By: Burman Nieves M.D.   On: 07/21/2015 23:21   I have personally reviewed and evaluated these images and lab results as part of my medical decision-making.   EKG Interpretation None      MDM   Final diagnoses:  Cough  Diarrhea   24 year old male here with cough and diarrhea. He is afebrile, nontoxic. No reported sick contacts. He is in no acute respiratory distress, dry cough noted on exam. Abdominal exam is benign. Labwork is overall reassuring. Chest x-ray is clear. Patient was given dose of Imodium here in the ED, no further diarrhea. He has continued tolerating PO without difficulty.  Suspect constellation of symptoms viral in nature. Will discharge home with supportive care.  Discussed plan with patient, he/she acknowledged understanding and agreed with plan of care.  Return precautions given for new or worsening symptoms.  Garlon Hatchet, PA-C 07/22/15 0024  Samuel Jester, DO 07/23/15 1357

## 2015-07-25 ENCOUNTER — Encounter (HOSPITAL_COMMUNITY): Payer: Self-pay | Admitting: *Deleted

## 2015-07-25 ENCOUNTER — Emergency Department (HOSPITAL_COMMUNITY)
Admission: EM | Admit: 2015-07-25 | Discharge: 2015-07-25 | Disposition: A | Discharge status: Home / Self Care | Payer: BLUE CROSS/BLUE SHIELD | Attending: Emergency Medicine | Admitting: Emergency Medicine

## 2015-07-25 DIAGNOSIS — Z0000 Encounter for general adult medical examination without abnormal findings: Secondary | ICD-10-CM | POA: Insufficient documentation

## 2015-07-25 DIAGNOSIS — Z008 Encounter for other general examination: Secondary | ICD-10-CM

## 2015-07-25 NOTE — ED Provider Notes (Signed)
CSN: [id number]     Arrival date & time 07/25/15  2101 History  This chart was scribed for non-physician provider Roxy Horseman, PA-C, working with Laurence Spates, MD by Phillis Haggis, ED Scribe. This patient was seen in room TR11C/TR11C and patient care was started at 9:33 PM.     Chief Complaint  Patient presents with  . Letter for School/Work   The history is provided by the patient. No language interpreter was used.  HPI Comments: Randall Banks is a 24 y.o. male who presents to the Emergency Department requesting a work note. Pt states that he was seen 4 days ago for nausea, productive cough, fever, vomiting, and diarrhea. He states that the symptoms relieved on Friday. He states that he needs a note stating that he was seen so he can return to work; pt works in [employer].   History reviewed. No pertinent past medical history. History reviewed. No pertinent past surgical history. No family history on file. Social History  Substance Use Topics  . Smoking status: Never Smoker   . Smokeless tobacco: None  . Alcohol Use: Yes     Comment: weekends    Review of Systems  Constitutional: Negative for fever and chills.  Respiratory: Negative for cough and shortness of breath.   Cardiovascular: Negative for chest pain.  Gastrointestinal: Negative for nausea, vomiting, diarrhea and constipation.  Genitourinary: Negative for dysuria.   Allergies  Shrimp  Home Medications   Prior to Admission medications   Medication Sig Start Date End Date Taking? Authorizing Provider  guaiFENesin (MUCINEX) 600 MG 12 hr tablet Take 600 mg by mouth daily as needed for cough.    Historical Provider, MD  ibuprofen (ADVIL,MOTRIN) 200 MG tablet Take 400 mg by mouth every 6 (six) hours as needed. pain     Historical Provider, MD  loperamide (IMODIUM) 2 MG capsule Take 1 capsule (2 mg total) by mouth 4 (four) times daily as needed for diarrhea or loose stools. 07/22/15   Garlon Hatchet, PA-C   BP  115/66 mmHg  Pulse 72  Temp(Src) 98.7 F (37.1 C) (Oral)  Resp 16  Ht  (1.854 m)  Wt 220 lb (99.791 kg)  BMI 29.03 kg/m2  SpO2 97%  Physical Exam  Constitutional: He is oriented to person, place, and time. He appears well-developed and well-nourished. No distress.  HENT:  Head: Normocephalic and atraumatic.  Eyes: Conjunctivae and EOM are normal. Pupils are equal, round, and reactive to light. Right eye exhibits no discharge. Left eye exhibits no discharge. No scleral icterus.  Neck: Normal range of motion. Neck supple. No JVD present.  Cardiovascular: Normal rate, regular rhythm and normal heart sounds.  Exam reveals no gallop and no friction rub.   No murmur heard. Pulmonary/Chest: Effort normal and breath sounds normal. No respiratory distress. He has no wheezes. He has no rales. He exhibits no tenderness.  Abdominal: Soft. He exhibits no distension and no mass. There is no tenderness. There is no rebound and no guarding.  Musculoskeletal: Normal range of motion. He exhibits no edema or tenderness.  Neurological: He is alert and oriented to person, place, and time.  Skin: Skin is warm and dry.  Psychiatric: He has a normal mood and affect. His behavior is normal. Judgment and thought content normal.  Nursing note and vitals reviewed.   ED Course  Procedures (including critical care time) DIAGNOSTIC STUDIES: Oxygen Saturation is 97% on RA, normal by my interpretation.    COORDINATION OF  CARE: 9:36 PM-Discussed treatment plan which includes note to return to work with pt at bedside and pt agreed to plan.    Labs Review Labs Reviewed - No data to display  Imaging Review No results found.    EKG Interpretation None      MDM   Final diagnoses:  Encounter for medical assessment    Patient is symptom-free from his recent viral illness. He is requesting a note so that he can return to work. As his symptoms have been absent for the past 2 days, will provide him  with requested a work note.  I personally performed the services described in this documentation, which was scribed in my presence. The recorded information has been reviewed and is accurate.     Roxy Horseman, PA-C 07/25/15 2204  Laurence Spates, MD 07/25/15 [phone]

## 2015-07-25 NOTE — Discharge Instructions (Signed)
Medical Screening Exam °A medical screening exam has been done. This exam helps find the cause of your problem and determines whether you need emergency treatment. Your exam has shown that you do not need emergency treatment at this point. It is safe for you to go to your caregiver's office or clinic for treatment. You should make an appointment today to see your caregiver as soon as he or she is available. °Depending on your illness, your symptoms and condition can change over time. If your condition gets worse or you develop new or troubling symptoms before you see your caregiver, you should return to the emergency department for further evaluation.  °Document Released: 12/21/2004 Document Revised: 02/05/2012 Document Reviewed: 08/02/2011 °[employer]® Patient Information ©2015 [employer]. This information is not intended to replace advice given to you by your health care provider. Make sure you discuss any questions you have with your health care provider. ° °

## 2015-07-25 NOTE — ED Notes (Signed)
Pt was seen here for diarrhea and had a work note.  His work requires a note stating that he may come back to work.  Pt states that his symptoms have resolved and he feels fine

## 2016-02-03 IMAGING — DX DG CHEST 2V
2 series · 2 of 2 positions shown · non-contrast
Comparison: None.

CLINICAL DATA: Cough, fever, and congestion for couple of days.

EXAM:
CHEST  2 VIEW

[chest pa]
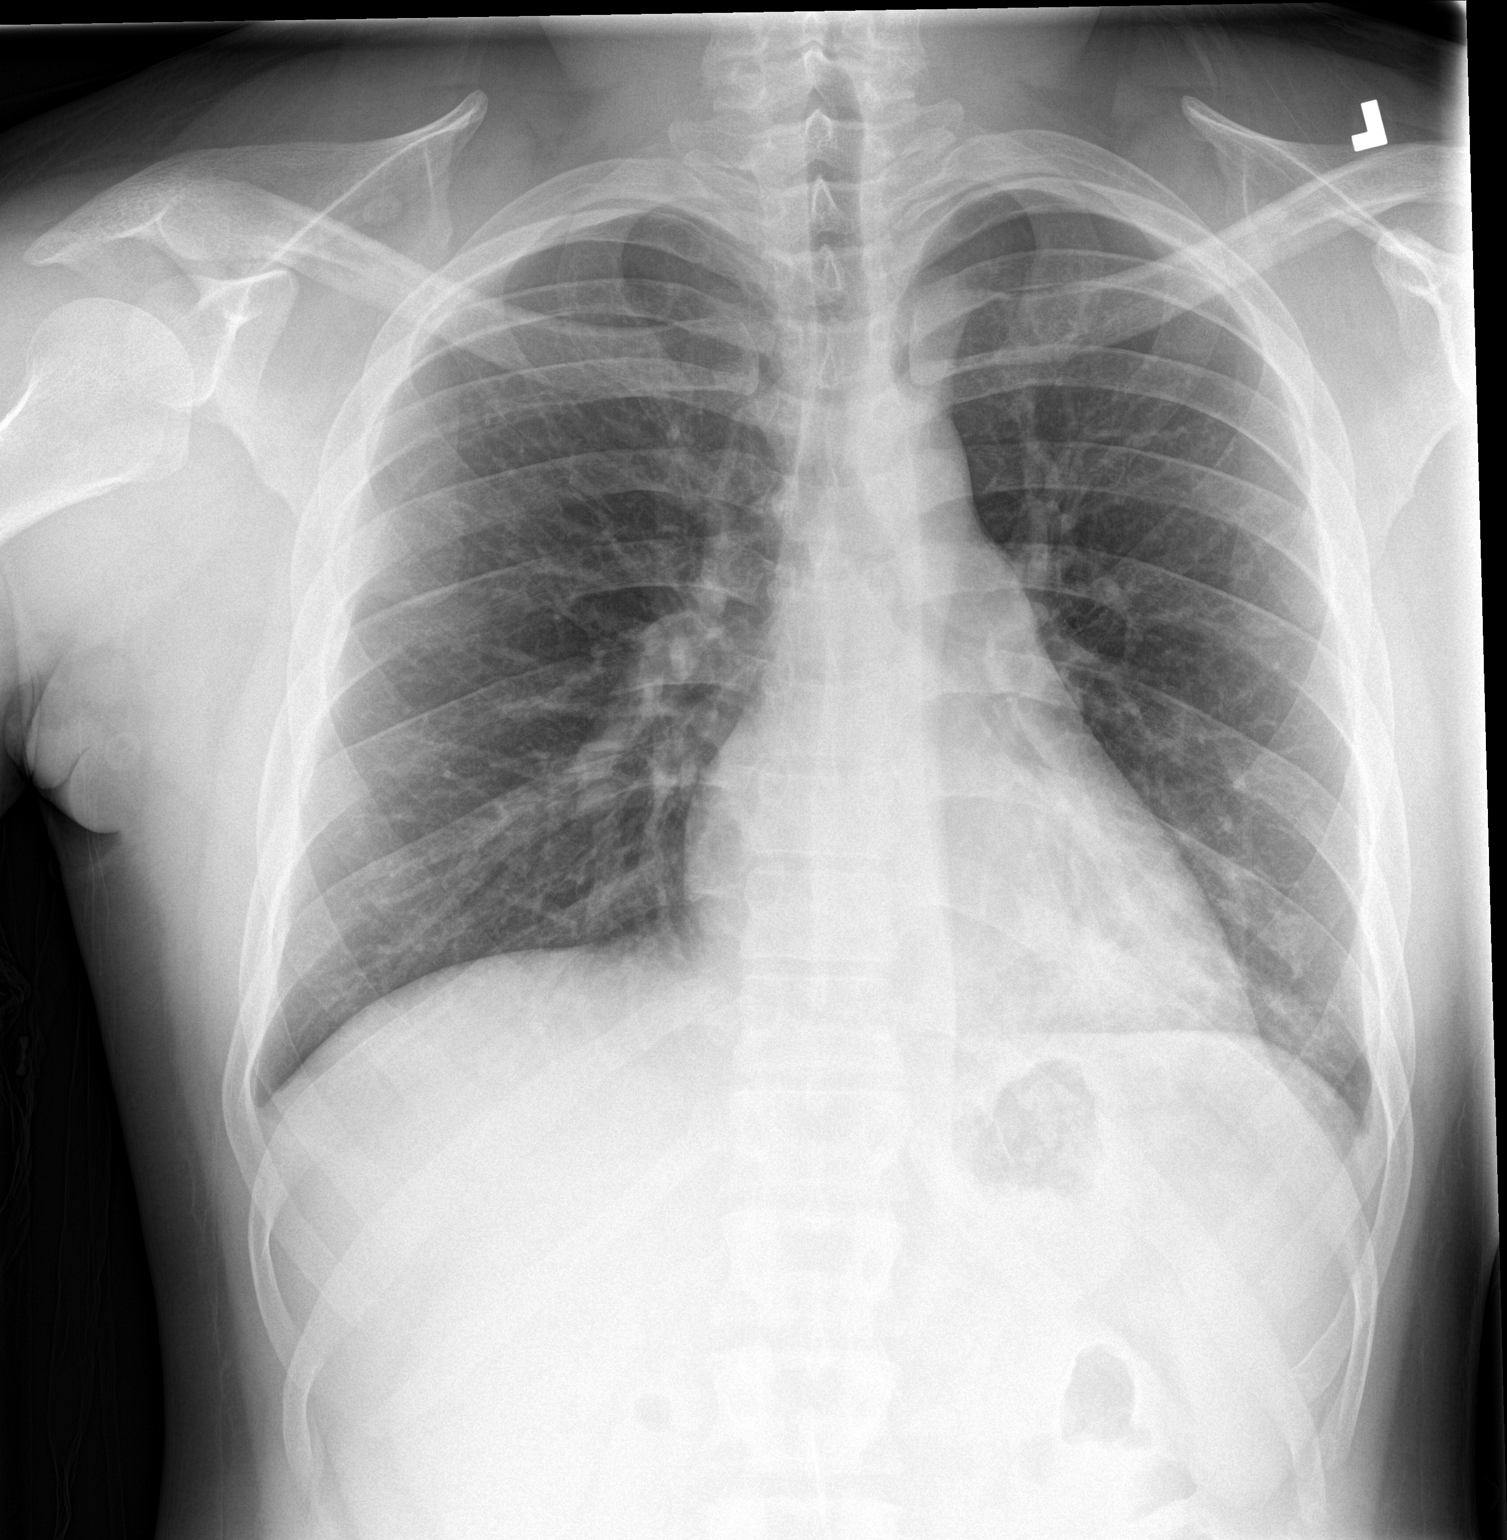

[chest lat]
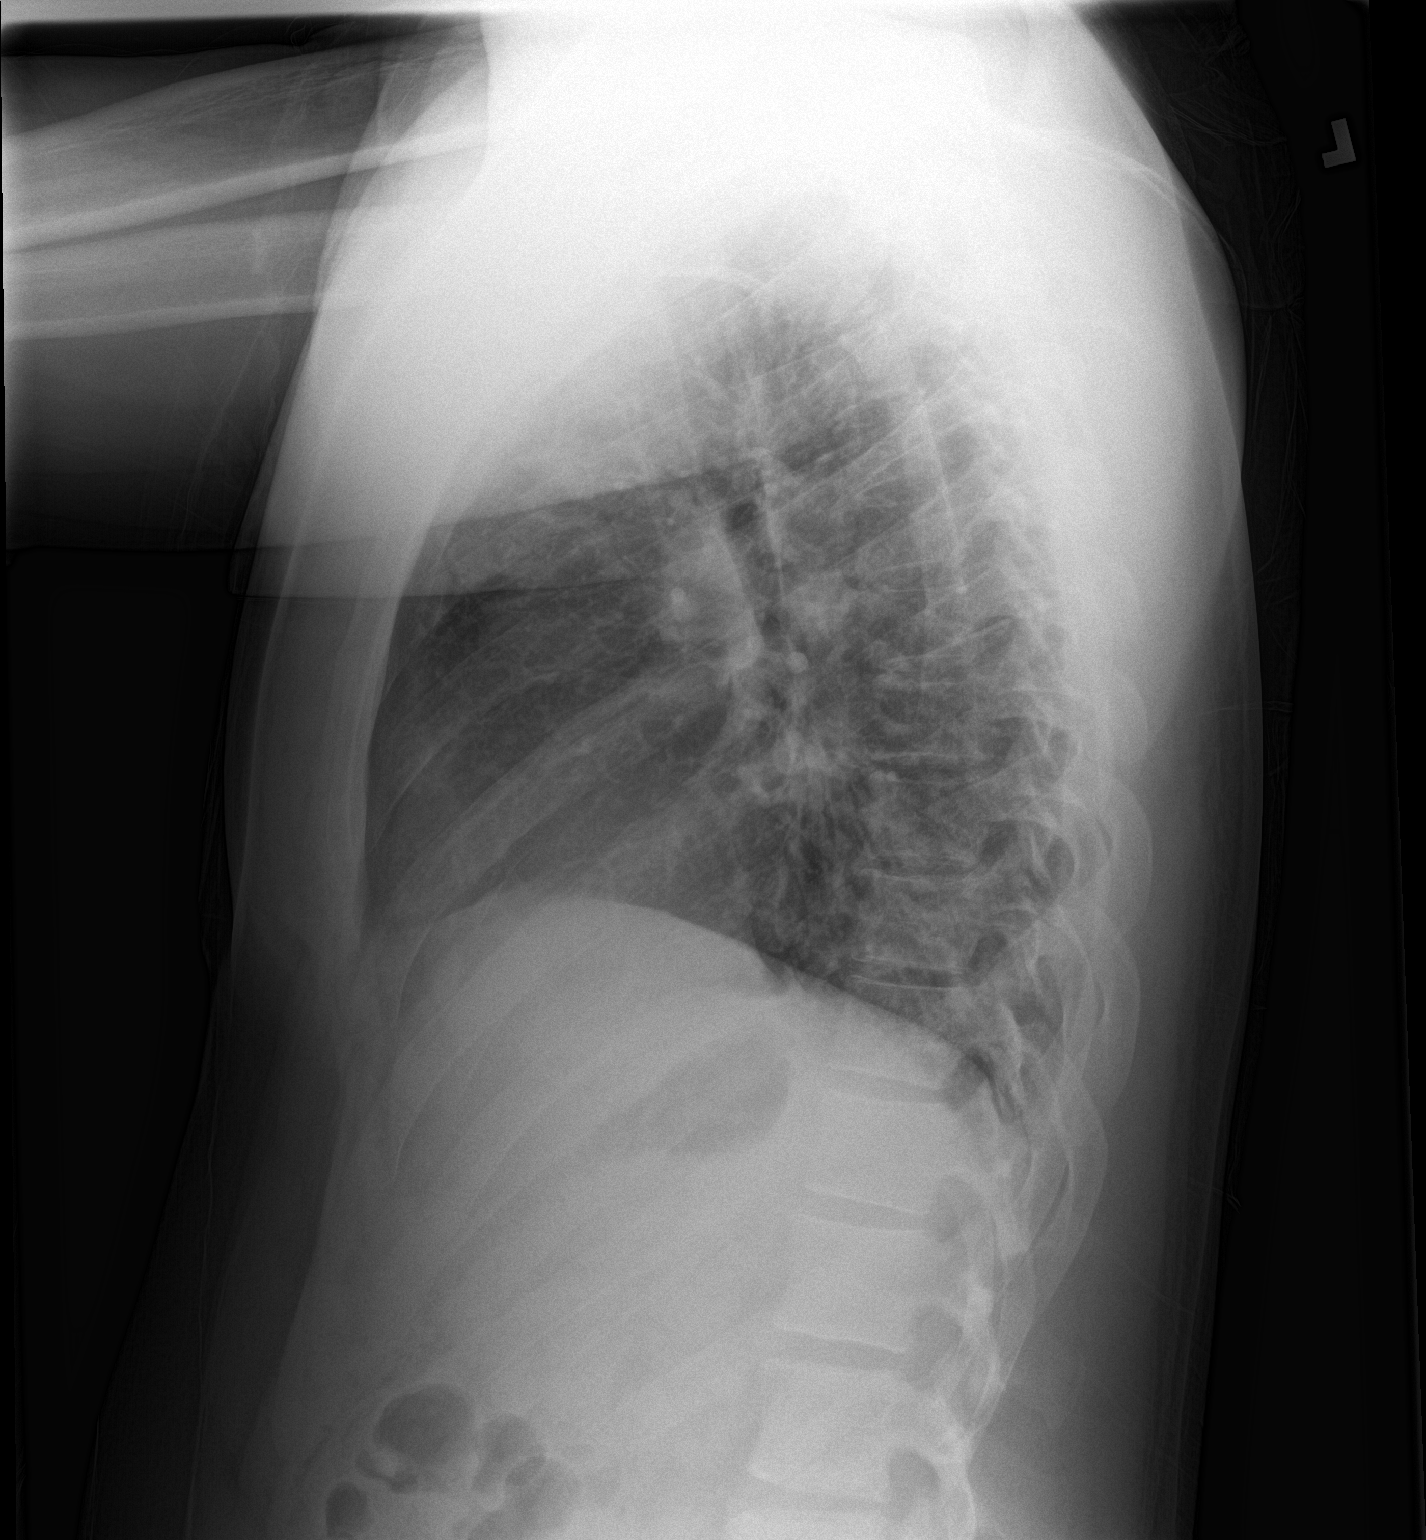

[2 of 2 positions shown; findings below may reference images not displayed]

FINDINGS: The heart size and mediastinal contours are within normal limits.
Both lungs are clear. The visualized skeletal structures are
unremarkable.
IMPRESSION: No active cardiopulmonary disease.

## 2016-03-11 ENCOUNTER — Emergency Department (HOSPITAL_COMMUNITY)
Admission: EM | Admit: 2016-03-11 | Discharge: 2016-03-11 | Disposition: A | Discharge status: Home / Self Care | Payer: Self-pay | Attending: Emergency Medicine | Admitting: Emergency Medicine

## 2016-03-11 ENCOUNTER — Encounter (HOSPITAL_COMMUNITY): Payer: Self-pay | Admitting: *Deleted

## 2016-03-11 DIAGNOSIS — Z113 Encounter for screening for infections with a predominantly sexual mode of transmission: Secondary | ICD-10-CM | POA: Insufficient documentation

## 2016-03-11 DIAGNOSIS — R21 Rash and other nonspecific skin eruption: Secondary | ICD-10-CM | POA: Insufficient documentation

## 2016-03-11 MED ORDER — HYDROCORTISONE 1 % EX CREA: TOPICAL_CREAM | CUTANEOUS | Status: DC

## 2016-03-11 NOTE — ED Notes (Signed)
Declined W/C at D/C and was escorted to lobby by RN. 

## 2016-03-11 NOTE — Discharge Instructions (Signed)
We will call you if any of your tests come back positive. Return to the emergency room for worsening condition or new concerning symptoms. Follow up with your regular doctor. If you don't have a regular doctor use one of the numbers below to establish a primary care doctor.   Emergency Department Resource Guide 1) Find a Doctor and Pay Out of Pocket Although you won't have to find out who is covered by your insurance plan, it is a good idea to ask around and get recommendations. You will then need to call the office and see if the doctor you have chosen will accept you as a new patient and what types of options they offer for patients who are self-pay. Some doctors offer discounts or will set up payment plans for their patients who do not have insurance, but you will need to ask so you aren't surprised when you get to your appointment.  2) Contact Your Local Health Department Not all health departments have doctors that can see patients for sick visits, but many do, so it is worth a call to see if yours does. If you don't know where your local health department is, you can check in your phone book. The CDC also has a tool to help you locate your state's health department, and many state websites also have listings of all of their local health departments.  3) Find a Walk-in Clinic If your illness is not likely to be very severe or complicated, you may want to try a walk in clinic. These are popping up all over the country in pharmacies, drugstores, and shopping centers. They're usually staffed by nurse practitioners or physician assistants that have been trained to treat common illnesses and complaints. They're usually fairly quick and inexpensive. However, if you have serious medical issues or chronic medical problems, these are probably not your best option.  No Primary Care Doctor: - Call Health Connect at  [phone] - they can help you locate a primary care doctor that  accepts your insurance,  provides certain services, etc. - Physician Referral [phone]  Emergency Department Resource Guide 1) Find a Doctor and Pay Out of Pocket Although you won't have to find out who is covered by your insurance plan, it is a good idea to ask around and get recommendations. You will then need to call the office and see if the doctor you have chosen will accept you as a new patient and what types of options they offer for patients who are self-pay. Some doctors offer discounts or will set up payment plans for their patients who do not have insurance, but you will need to ask so you aren't surprised when you get to your appointment.  2) Contact Your Local Health Department Not all health departments have doctors that can see patients for sick visits, but many do, so it is worth a call to see if yours does. If you don't know where your local health department is, you can check in your phone book. The CDC also has a tool to help you locate your state's health department, and many state websites also have listings of all of their local health departments.  3) Find a Walk-in Clinic If your illness is not likely to be very severe or complicated, you may want to try a walk in clinic. These are popping up all over the country in pharmacies, drugstores, and shopping centers. They're usually staffed by nurse practitioners or physician assistants that have been trained to  treat common illnesses and complaints. They're usually fairly quick and inexpensive. However, if you have serious medical issues or chronic medical problems, these are probably not your best option.  No Primary Care Doctor: - Call Health Connect at  [phone] - they can help you locate a primary care doctor that  accepts your insurance, provides certain services, etc. - Physician Referral Service- [phone]  Chronic Pain Problems: Organization         Address  Phone   Notes  [facility]  [phone]) [phone]  Patients need to be referred by their primary care doctor.   Medication Assistance: Organization         Address  Phone   Notes  [facility] [address] [place], [place] [address] [phone]) [phone] --Must be a resident of [facility] [place] -- Must have NO insurance coverage whatsoever (no Medicaid/ Medicare, etc.) -- The pt. MUST have a primary care doctor that directs their care regularly and follows them in the community   [facility]ist  [phone]   [employer]  [phone]    Agencies that provide inexpensive medical care: Organization         Address  Phone   Notes  [facility]  [phone](336) [phone]   [facility]    [phone]) [phone]   [facility] [address] [place], [place] [address] [phone]   [facility] [address], [place] [phone]   [facility]    [phone]   [facility]    [phone](336) [phone]   [facility]  [address], [place] Phone:  [phone]) [phone], Fax:  [phone] Hours of Operation:  9 am - 6 pm, M-F.  Also accepts Medicaid/Medicare and self-pay.  [facility]  [address], [place] Phone: [phone]) [phone], Fax: [phone]) [phone]. Hours of Operation:  8:30 am - 5:30 pm, M-F.  Also accepts Medicaid and self-pay.  [facility] [place] [address], [place] Phone: [phone](336) [phone]   [facility] [address], [place], [place] [phone], Ext. [phone] Mondays & Thursdays: 7-9 AM.  First 15 patients are seen on a first come, first serve basis.    Medicaid-accepting [facility] Providers:  Organization         Address  Phone   Notes  [facility] [address], [place] [phone] Also accepts self-pay patients.  [facility] [address], [place]  [phone](336)  [phone]   [facility] [address], [place] [phone]) [phone]   [facility] [address] [place] [address], [place] [phone]   Renaye RakersVeita Bland [address], [place]   [phone] Only accepts [place] patients after they have their name applied to their card.   Self-Pay (no insurance) in [facility]:  Organization         Address  Phone   Notes  Sickle Cell Patients, [facility] [address], [place] [phone]   [facility] [address], [place] [phone]   [facility] [place]  [address], [place] [phone]   [facility]/Dr. Osei-Bonsu  [address] [place] [address], [place] or [phone], [place] [phone] Phone number for both [facility] and [place] locations is the same.  [facility] [address], [place] [phone]) [phone]   [facility] [address] [place] [address], [place] or [address] [phone]) [phone]) [phone]   [facility] [address], [place] [phone]) [phone], phone; [phone], fax Sees patients 1st and 3rd Saturday of every month.  Must not qualify for public or private insurance (i.e. Medicaid, Medicare, [place] Choice, Veterans' Benefits)  Household income should be no more than 200% of the poverty level The clinic cannot treat you if you are pregnant or think you are pregnant  Sexually transmitted diseases are not treated at the clinic.

## 2016-03-11 NOTE — ED Notes (Signed)
Pt has rash, bumps and itching to groin area. Requesting std check. Denies any burning or pain with urination, denies discharge.

## 2016-03-11 NOTE — ED Provider Notes (Signed)
CSN: [id number]     Arrival date & time 03/11/16  1106 History  By signing my name below, I, Octavia Heirrianna Nassar, attest that this documentation has been prepared under the direction and in the presence of Isabella Ida Y Srishti Strnad, [place]-C. Electronically Signed: Octavia HeirArianna Nassar, ED Scribe. 03/11/2016. 12:00 PM.      Chief Complaint  Patient presents with  . SEXUALLY TRANSMITTED DISEASE      The history is provided by the patient. No language interpreter was used.   HPI Comments: Randall Banks is a 25 y.o. male who presents to the Emergency Department presenting with the request for an STD check. Pt complains of sudden onset, gradual worsening, moderate, itching, small bumps in his groin area that he noticed yesterday. Pt is sexually active with more than one partner but they have not expressed any symptoms. States intermittent condom use. Denies penile discharge, penile swelling, dysuria, burning with urination, fever, chills, nausea, vomiting, and abdominal pain.  History reviewed. No pertinent past medical history. History reviewed. No pertinent past surgical history. History reviewed. No pertinent family history. Social History  Substance Use Topics  . Smoking status: Never Smoker   . Smokeless tobacco: None  . Alcohol Use: Yes     Comment: weekends    Review of Systems  Constitutional: Negative for fever and chills.  Gastrointestinal: Negative for nausea, vomiting and abdominal pain.  Genitourinary: Negative for dysuria, discharge, penile swelling, scrotal swelling, penile pain and testicular pain.  Skin: Positive for rash.  All other systems reviewed and are negative.     Allergies  Shrimp  Home Medications   Prior to Admission medications   Medication Sig Start Date End Date Taking? Authorizing Provider  guaiFENesin (MUCINEX) 600 MG 12 hr tablet Take 600 mg by mouth daily as needed for cough.    Historical Provider, MD  ibuprofen (ADVIL,MOTRIN) 200 MG tablet Take 400 mg by mouth every 6  (six) hours as needed. pain     Historical Provider, MD  loperamide (IMODIUM) 2 MG capsule Take 1 capsule (2 mg total) by mouth 4 (four) times daily as needed for diarrhea or loose stools. 07/22/15   Garlon HatchetLisa M Sanders, PA-C   Triage vitals: BP 134/89 mmHg  Pulse 75  Temp(Src) 98 F (36.7 C) (Oral)  Resp 18  SpO2 99% Physical Exam  Constitutional: He is oriented to person, place, and time. He appears well-developed and well-nourished.  HENT:  Head: Normocephalic.  Eyes: EOM are normal.  Neck: Normal range of motion.  Pulmonary/Chest: Effort normal.  Abdominal: He exhibits no distension.  Genitourinary:  Groin and base of penis with multple small papular regions, no vesicles or pustules, penis otherwise unremarkable, scrotum and testes unremarkable  No erythema or swelling, no penile discharge  Musculoskeletal: Normal range of motion.  Neurological: He is alert and oriented to person, place, and time.  Psychiatric: He has a normal mood and affect.  Nursing note and vitals reviewed.   ED Course  Procedures  DIAGNOSTIC STUDIES: Oxygen Saturation is 99% on RA, normal by my interpretation.  COORDINATION OF CARE:  12:00 PM Discussed treatment plan which includes urinalysis with pt at bedside and pt agreed to plan.  Labs Review Labs Reviewed  HIV ANTIBODY (ROUTINE TESTING)  RPR  GC/CHLAMYDIA PROBE AMP ([place]) NOT AT [facility]    Imaging Review No results found. I have personally reviewed and evaluated these images and lab results as part of my medical decision-making.   EKG Interpretation None  MDM   Final diagnoses:  Encounter for screening examination for sexually transmitted disease    Rash consistent with razor burn/mild folliculitis. No pustules or vesicles. No edema or erythema. No penile discharge. Rx given for hydrocortisone cream. No abx indicated at this time. GC/chlamydia, HIV, and RPR sent. Encouraged safe sex practices. Resource guide given to establish  PCP. ER return precautions given.   I personally performed the services described in this documentation, which was scribed in my presence. The recorded information has been reviewed and is accurate.   Carlene Coria, PA-C 03/11/16 1306  Margarita Grizzle, MD 03/12/16 [phone]

## 2016-03-12 LAB — HIV ANTIBODY (ROUTINE TESTING W REFLEX): HIV Screen 4th Generation wRfx: NONREACTIVE

## 2016-03-12 LAB — RPR: RPR Ser Ql: NONREACTIVE

## 2016-03-13 LAB — GC/CHLAMYDIA PROBE AMP (~~LOC~~) NOT AT ARMC
CHLAMYDIA, DNA PROBE: NEGATIVE
NEISSERIA GONORRHEA: NEGATIVE

## 2022-07-18 DIAGNOSIS — E611 Iron deficiency: Secondary | ICD-10-CM | POA: Insufficient documentation

## 2022-07-18 DIAGNOSIS — E785 Hyperlipidemia, unspecified: Secondary | ICD-10-CM | POA: Insufficient documentation

## 2022-07-18 DIAGNOSIS — R7303 Prediabetes: Secondary | ICD-10-CM | POA: Insufficient documentation

## 2022-07-18 DIAGNOSIS — E559 Vitamin D deficiency, unspecified: Secondary | ICD-10-CM | POA: Insufficient documentation

## 2022-08-03 DIAGNOSIS — D239 Other benign neoplasm of skin, unspecified: Secondary | ICD-10-CM | POA: Insufficient documentation

## 2022-08-03 DIAGNOSIS — B360 Pityriasis versicolor: Secondary | ICD-10-CM | POA: Insufficient documentation

## 2022-09-28 ENCOUNTER — Ambulatory Visit (HOSPITAL_COMMUNITY)
Admission: EM | Admit: 2022-09-28 | Discharge: 2022-09-28 | Disposition: A | Discharge status: Home / Self Care | Payer: No Typology Code available for payment source | Attending: Internal Medicine | Admitting: Internal Medicine

## 2022-09-28 ENCOUNTER — Encounter (HOSPITAL_COMMUNITY): Payer: Self-pay

## 2022-09-28 DIAGNOSIS — Z1152 Encounter for screening for COVID-19: Secondary | ICD-10-CM | POA: Insufficient documentation

## 2022-09-28 DIAGNOSIS — J029 Acute pharyngitis, unspecified: Secondary | ICD-10-CM | POA: Insufficient documentation

## 2022-09-28 DIAGNOSIS — Z79899 Other long term (current) drug therapy: Secondary | ICD-10-CM | POA: Diagnosis not present

## 2022-09-28 LAB — POCT RAPID STREP A, ED / UC: Streptococcus, Group A Screen (Direct): NEGATIVE

## 2022-09-28 LAB — SARS CORONAVIRUS 2 (TAT 6-24 HRS): SARS Coronavirus 2: NEGATIVE

## 2022-09-28 MED ORDER — IBUPROFEN 800 MG PO TABS
ORAL_TABLET | ORAL | Status: AC
  Filled 2022-09-28: qty 1

## 2022-09-28 MED ORDER — IBUPROFEN 800 MG PO TABS
800.0000 mg | ORAL_TABLET | Freq: Once | ORAL | Status: AC
  Administered 2022-09-28: 800 mg via ORAL

## 2022-09-28 NOTE — Discharge Instructions (Signed)
You have a viral upper respiratory infection.  COVID-19 testing is pending. We will call you with results if positive. Wear a mask in public until we receive your results.  Strep testing was negative, throat culture is pending and we will call if antibiotics are needed.  You may take tylenol 1,000mg  and ibuprofen 800mg  every 8 hours with food as needed for fever/chills, sore throat, aches/pains, and inflammation associated with viral illness. Take this with food to avoid stomach upset.    You may do salt water and baking soda gargles every 4 hours as needed for your throat pain.  Please put 1/2 teaspoon of salt and 1/4 teaspoon of baking soda in 8 ounces of warm water then gargle and spit the water out. You may also put 1 tablespoon of honey in warm water and drink this to soothe your throat.  Place a humidifier in your room at night to help decrease dry air that can irritate your airway and cause you to have a sore throat and cough.  Please try to eat a well-balanced diet while you are sick so that your body gets proper nutrition to heal.  If you develop any new or worsening symptoms, please return.  If your symptoms are severe, please go to the emergency room.  Follow-up with your primary care provider for further evaluation and management of your symptoms as well as ongoing wellness visits.  I hope you feel better!

## 2022-09-28 NOTE — ED Triage Notes (Signed)
Pt reports sore throat x 1 day. No other symptoms.

## 2022-09-28 NOTE — ED Provider Notes (Addendum)
[facility]    CSN: [id number] Arrival date & time: 09/28/22  0017      History   Chief Complaint Chief Complaint  Patient presents with   Sore Throat    HPI Randall Banks is a 31 y.o. male.   Patient presents to urgent care for evaluation of sore throat that started yesterday.  He has a history of frequent streptococcal infections and would like to be checked for strep throat as his throat pain feels very similar to the last time he had strep throat.  Pain is currently an 8 on a scale of 0-10.  He has not attempted use of any over-the-counter medications prior to arrival urgent care for symptoms.  Denies fever/chills, nausea, vomiting, abdominal pain, headache, shortness of breath, chest pain, difficulty swallowing, or dizziness.  He does state that it is very painful to swallow but he does not feel as though his throat is closing.  He did just travel through the airport yesterday from [facility] to [place] and did not wear a mask.  No cough, nasal congestion, body aches, loss of taste or smell, or vision changes.  He is vaccinated against COVID-19 and has received boosters.  He is not a smoker and denies history of chronic respiratory problems.  He does not take any medications daily for chronic medical problems.  No recent antibiotics or steroids to report.   Sore Throat    History reviewed. No pertinent past medical history.  There are no problems to display for this patient.   History reviewed. No pertinent surgical history.     Home Medications    Prior to Admission medications   Medication Sig Start Date End Date Taking? Authorizing Provider  guaiFENesin (MUCINEX) 600 MG 12 hr tablet Take 600 mg by mouth daily as needed for cough.    [provider]  hydrocortisone cream 1 % Apply to affected area 2 times daily 03/11/16   Eliseo Squires, PA-C  ibuprofen (ADVIL,MOTRIN) 200 MG tablet Take 400 mg by mouth every 6 (six) hours as needed. pain      [provider]  loperamide (IMODIUM) 2 MG capsule Take 1 capsule (2 mg total) by mouth 4 (four) times daily as needed for diarrhea or loose stools. 07/22/15   Garlon Hatchet, PA-C    Family History History reviewed. No pertinent family history.  Social History Social History   Tobacco Use   Smoking status: Never  Substance Use Topics   Alcohol use: Yes    Comment: weekends   Drug use: No     Allergies   Shrimp [shellfish allergy]   Review of Systems Review of Systems Per HPI  Physical Exam Triage Vital Signs ED Triage Vitals [09/28/22 0829]  Enc Vitals Group     BP 135/87     Pulse Rate 82     Resp 18     Temp 98.7 F (37.1 C)     Temp Source Oral     SpO2 98 %     Weight      Height      Head Circumference      Peak Flow      Pain Score      Pain Loc      Pain Edu?      Excl. in GC?    No data found.  Updated Vital Signs BP 135/87 (BP Location: Left Arm)   Pulse 82   Temp 98.7 F (37.1 C) (Oral)  Resp 18   SpO2 98%   Visual Acuity Right Eye Distance:   Left Eye Distance:   Bilateral Distance:    Right Eye Near:   Left Eye Near:    Bilateral Near:     Physical Exam Vitals and nursing note reviewed.  Constitutional:      Appearance: He is not ill-appearing or toxic-appearing.  HENT:     Head: Normocephalic and atraumatic.     Right Ear: Hearing, tympanic membrane, ear canal and external ear normal.     Left Ear: Hearing, tympanic membrane, ear canal and external ear normal.     Nose: Nose normal. No congestion or rhinorrhea.     Mouth/Throat:     Lips: Pink.     Mouth: Mucous membranes are moist. No oral lesions.     Pharynx: Uvula midline. Posterior oropharyngeal erythema present. No pharyngeal swelling, oropharyngeal exudate or uvula swelling.     Tonsils: No tonsillar exudate or tonsillar abscesses. 1+ on the right. 1+ on the left.  Eyes:     General: Lids are normal. Vision grossly intact. Gaze aligned appropriately.      Extraocular Movements: Extraocular movements intact.     Conjunctiva/sclera: Conjunctivae normal.     Pupils: Pupils are equal, round, and reactive to light.  Cardiovascular:     Rate and Rhythm: Normal rate and regular rhythm.     Heart sounds: Normal heart sounds, S1 normal and S2 normal.  Pulmonary:     Effort: Pulmonary effort is normal. No respiratory distress.     Breath sounds: Normal breath sounds and air entry.  Abdominal:     Palpations: Abdomen is soft.  Musculoskeletal:     Cervical back: Normal range of motion and neck supple.  Lymphadenopathy:     Cervical: Cervical adenopathy present.  Skin:    General: Skin is warm and dry.     Capillary Refill: Capillary refill takes less than 2 seconds.     Findings: No rash.  Neurological:     General: No focal deficit present.     Mental Status: He is alert and oriented to person, place, and time. Mental status is at baseline.     Cranial Nerves: No dysarthria or facial asymmetry.  Psychiatric:        Mood and Affect: Mood normal.        Speech: Speech normal.        Behavior: Behavior normal.        Thought Content: Thought content normal.        Judgment: Judgment normal.      UC Treatments / Results  Labs (all labs ordered are listed, but only abnormal results are displayed) Labs Reviewed  CULTURE, GROUP A STREP ([facility])  SARS CORONAVIRUS 2 (TAT 6-24 HRS)  POCT RAPID STREP A, ED / UC    EKG   Radiology No results found.  Procedures Procedures (including critical care time)  Medications Ordered in UC Medications  ibuprofen (ADVIL) tablet 800 mg (has no administration in time range)    Initial Impression / Assessment and Plan / UC Course  I have reviewed the triage vital signs and the nursing notes.  Pertinent labs & imaging results that were available during my care of the patient were reviewed by me and considered in my medical decision making (see chart for details).   1.  Viral pharyngitis Group A  strep testing is negative.  Throat culture is pending.  COVID-19 testing is also pending.  We  will call patient if results are positive.  He is to wear a mask in public until NUUVO-53 testing results have come back, quarantine guidelines discussed.   He is a candidate for antiviral therapy if COVID-19 test is positive.  Ibuprofen given in clinic to help with throat pain.  He may take ibuprofen 800 mg every 8 hours as needed for pain and inflammation to the oropharynx.  Rest, increase fluids, and intake of soft foods to avoid throat irritation recommended. Nonpharmacologic methods of sore throat relief provided in discharge summary.   Discussed physical exam and available lab work findings in clinic with patient.  Counseled patient regarding appropriate use of medications and potential side effects for all medications recommended or prescribed today. Discussed red flag signs and symptoms of worsening condition,when to call the PCP office, return to urgent care, and when to seek higher level of care in the emergency department. Patient verbalizes understanding and agreement with plan. All questions answered. Patient discharged in stable condition.    Final Clinical Impressions(s) / UC Diagnoses   Final diagnoses:  Viral pharyngitis     Discharge Instructions      You have a viral upper respiratory infection.  COVID-19 testing is pending. We will call you with results if positive. Wear a mask in public until we receive your results.  Strep testing was negative, throat culture is pending and we will call if antibiotics are needed.  You may take tylenol 1,000mg  and ibuprofen 800mg  every 8 hours with food as needed for fever/chills, sore throat, aches/pains, and inflammation associated with viral illness. Take this with food to avoid stomach upset.    You may do salt water and baking soda gargles every 4 hours as needed for your throat pain.  Please put 1/2 teaspoon of salt and 1/4 teaspoon of  baking soda in 8 ounces of warm water then gargle and spit the water out. You may also put 1 tablespoon of honey in warm water and drink this to soothe your throat.  Place a humidifier in your room at night to help decrease dry air that can irritate your airway and cause you to have a sore throat and cough.  Please try to eat a well-balanced diet while you are sick so that your body gets proper nutrition to heal.  If you develop any new or worsening symptoms, please return.  If your symptoms are severe, please go to the emergency room.  Follow-up with your primary care provider for further evaluation and management of your symptoms as well as ongoing wellness visits.  I hope you feel better!      ED Prescriptions   None    PDMP not reviewed this encounter.   Talbot Grumbling, [place] 09/28/22 [place], [place], [place] 09/28/22 [phone]

## 2022-09-29 LAB — CULTURE, GROUP A STREP (THRC)

## 2022-09-30 LAB — CULTURE, GROUP A STREP (THRC)

## 2024-05-14 ENCOUNTER — Ambulatory Visit: Admitting: Physician Assistant

## 2024-05-14 ENCOUNTER — Encounter: Payer: Self-pay | Admitting: Physician Assistant

## 2024-05-14 VITALS — BP 118/88 | HR 74 | Temp 98.7°F | Ht 73.0 in

## 2024-05-14 DIAGNOSIS — L989 Disorder of the skin and subcutaneous tissue, unspecified: Secondary | ICD-10-CM

## 2024-05-14 DIAGNOSIS — D509 Iron deficiency anemia, unspecified: Secondary | ICD-10-CM | POA: Insufficient documentation

## 2024-05-14 DIAGNOSIS — B360 Pityriasis versicolor: Secondary | ICD-10-CM | POA: Diagnosis not present

## 2024-05-14 DIAGNOSIS — D508 Other iron deficiency anemias: Secondary | ICD-10-CM

## 2024-05-14 DIAGNOSIS — M25522 Pain in left elbow: Secondary | ICD-10-CM | POA: Diagnosis not present

## 2024-05-14 MED ORDER — DICLOFENAC SODIUM 1 % EX GEL: 2.0000 g | Freq: Four times a day (QID) | CUTANEOUS | 1 refills | Status: AC

## 2024-05-14 MED ORDER — MELOXICAM 15 MG PO TABS: 15.0000 mg | ORAL_TABLET | Freq: Every day | ORAL | 0 refills | Status: AC

## 2024-05-14 MED ORDER — KETOCONAZOLE 2 % EX SHAM: 1.0000 | MEDICATED_SHAMPOO | CUTANEOUS | 1 refills | Status: AC

## 2024-05-14 NOTE — Progress Notes (Signed)
 Date:  05/14/2024   Name:  Engelbert Sevin   DOB:  13-Jan-1991   MRN:  [id number]   Chief Complaint: Establish Care, Elbow Pain, and Skin Problem (Wants referral to derm,X6 months,  mole on the back of neck, not painful, was using medication in [place](ketaconazole shampoo) which helped )  Arm Pain  Incident onset: X 3 months. The incident occurred at the gym. The injury mechanism was repetitive motion. The pain is present in the left elbow. The quality of the pain is described as aching. The pain does not radiate. The pain is at a severity of 8/10. The pain is moderate. Pain course: comes and goes with movement. The symptoms are aggravated by movement and lifting. He has tried NSAIDs for the symptoms. The treatment provided no relief.   Jaedyn is a 33 year old male with history of iron deficiency anemia, vitamin D deficiency, prediabetes, hyperlipidemia new to the practice today to establish care, moved here from [place]  Dec 2024. Last physical estimated at >10y ago.   Mentions left elbow pain, thinks he overdid it in the gym 3 months ago with overhead triceps extensions (skull crushers) using heavy dumbbells. Ibuprofen  and naproxen have not helped, has not tried any topicals. Tried 2 weeks rest but then went back a few days ago at a lower weight and had the same pain, reports intermittent clicking.   Has a skin spot on the back of the neck for the last 6 months give or take. Seems stable, not particularly bothersome.   Previously prescribed ketoconazole shampoo for tinea versicolor, requesting refill on this.     Medication list has been reviewed and updated.  Current Meds  Medication Sig   diclofenac Sodium (VOLTAREN) 1 % GEL Apply 2 g topically 4 (four) times daily. Use on affected joint up to 4x/day as needed. 2 grams is roughly 4 fingertips' worth of gel.   [START ON 05/15/2024] ketoconazole (NIZORAL) 2 % shampoo Apply 1 Application topically 2 (two) times a week.   meloxicam (MOBIC) 15  MG tablet Take 1 tablet (15 mg total) by mouth daily. Take with food. Do not take with other NSAIDs (ibuprofen , naproxen, aspirin).     Review of Systems  Patient Active Problem List   Diagnosis Date Noted   Iron deficiency anemia 05/14/2024   Left elbow pain 05/14/2024   Tinea versicolor 08/03/2022   Dermatofibroma 08/03/2022   Hyperlipidemia 07/18/2022   Prediabetes 07/18/2022   Vitamin D deficiency 07/18/2022   Dietary iron deficiency without anemia 07/18/2022    Allergies  Allergen Reactions   Shellfish Allergy Anaphylaxis, Dermatitis, Hives, Itching, Shortness Of Breath and Swelling     There is no immunization history on file for this patient.  History reviewed. No pertinent surgical history.  Social History   Tobacco Use   Smoking status: Never   Smokeless tobacco: Never  Vaping Use   Vaping status: Never Used  Substance Use Topics   Alcohol use: Yes    Comment: socially   Drug use: No    Family History  Problem Relation Age of Onset   Diabetes Maternal Grandmother         05/14/2024    2:16 PM  GAD 7 : Generalized Anxiety Score  Nervous, Anxious, on Edge 0  Control/stop worrying 0  Worry too much - different things 0  Trouble relaxing 0  Restless 0  Easily annoyed or irritable 0  Afraid - awful might happen 0  Total GAD 7 Score 0  Anxiety Difficulty Not difficult at all       05/14/2024    2:16 PM  Depression screen PHQ 2/9  Decreased Interest 0  Down, Depressed, Hopeless 0  PHQ - 2 Score 0    BP Readings from Last 3 Encounters:  05/14/24 118/88  09/28/22 135/87  03/11/16 127/75    Wt Readings from Last 3 Encounters:  07/25/15 220 lb (99.8 kg)  03/21/15 222 lb (100.7 kg)    BP 118/88   Pulse 74   Temp 98.7 F (37.1 C)   Ht 6' 1 (1.854 m)   SpO2 97%   BMI 29.03 kg/m   Physical Exam Vitals and nursing note reviewed.  Constitutional:      Appearance: Normal appearance.   Cardiovascular:     Rate and Rhythm: Normal  rate.  Pulmonary:     Effort: Pulmonary effort is normal.  Abdominal:     General: There is no distension.   Musculoskeletal:        General: Normal range of motion.     Comments: Left elbow without TTP today. Subtle clicking appreciated during ROM exercises today without pain. Strength is 5/5 in flexion, extension, pronation, and supination. Replicating the problematic exercise (skull crusher) in clinic today with resistance does reproduce his pain.    Skin:    General: Skin is warm and dry.     Comments: 4 mm papule central lower neck with mild hyperpigmentation and defined borders. Question if ingrown hair. Torso with fairly extensive hypopigmented coalescing macules consistent with versicolor.   Neurological:     Mental Status: He is alert and oriented to person, place, and time.     Gait: Gait is intact.   Psychiatric:        Mood and Affect: Mood and affect normal.     Recent Labs     Component Value Date/Time   NA 136 07/21/2015 2151   K 3.6 07/21/2015 2151   CL 104 07/21/2015 2151   CO2 24 07/21/2015 2151   GLUCOSE 110 (H) 07/21/2015 2151   BUN 11 07/21/2015 2151   CREATININE 1.21 07/21/2015 2151   CALCIUM 9.2 07/21/2015 2151   PROT 6.9 07/21/2015 2151   ALBUMIN 3.6 07/21/2015 2151   AST 30 07/21/2015 2151   ALT 24 07/21/2015 2151   ALKPHOS 89 07/21/2015 2151   BILITOT 0.7 07/21/2015 2151   GFRNONAA >60 07/21/2015 2151   GFRAA >60 07/21/2015 2151    Lab Results  Component Value Date   WBC 8.3 07/21/2015   HGB 12.7 (L) 07/21/2015   HCT 38.3 (L) 07/21/2015   MCV 72.1 (L) 07/21/2015   PLT 186 07/21/2015   No results found for: HGBA1C No results found for: CHOL, HDL, LDLCALC, LDLDIRECT, TRIG, CHOLHDL No results found for: TSH   Assessment and Plan:  1. Left elbow pain (Primary) Suspect mild tendonitis vs other overuse injury. Trial meloxicam as below, also okay to use topical diclofenac as desired, reviewed instructions with patient.  Relative rest with the elbow,   - meloxicam (MOBIC) 15 MG tablet; Take 1 tablet (15 mg total) by mouth daily. Take with food. Do not take with other NSAIDs (ibuprofen , naproxen, aspirin).  Dispense: 30 tablet; Refill: 0 - diclofenac Sodium (VOLTAREN) 1 % GEL; Apply 2 g topically 4 (four) times daily. Use on affected joint up to 4x/day as needed. 2 grams is roughly 4 fingertips' worth of gel.  Dispense: 100 g; Refill: 1  2. Tinea versicolor Refill ketoconazole shampoo which  seems to be helping quite a bit per patient.  - ketoconazole (NIZORAL) 2 % shampoo; Apply 1 Application topically 2 (two) times a week.  Dispense: 120 mL; Refill: 1  3. Skin lesion of neck Favor ingrown hair, advised warm compress BID to see if this resolves. No concerning features. Patient would like to hold off on derm referral at this time.   4. Iron deficiency anemia secondary to inadequate dietary iron intake Recommended multivitamin with iron. Plan for iron panel along with routine labs next time.       Return in about 4 weeks (around 06/11/2024) for CPE.    Cody Das, PA-C, DMSc, Nutritionist [facility] [phone]

## 2024-05-14 NOTE — Patient Instructions (Signed)
-  It was a pleasure to see you today! Please review your visit summary for helpful information -I would encourage you to follow your care via MyChart where you can access lab results, notes, messages, and more -If you feel that we did a nice job today, please complete your after-visit survey and leave [place] a Google review! Your CMA today was Mariann Barter and your provider was Alvester Morin, PA-C, DMSc -Please return for follow-up in about 1 month

## 2024-06-13 ENCOUNTER — Encounter: Payer: Self-pay | Admitting: Physician Assistant

## 2024-06-13 ENCOUNTER — Ambulatory Visit (INDEPENDENT_AMBULATORY_CARE_PROVIDER_SITE_OTHER): Admitting: Physician Assistant

## 2024-06-13 VITALS — BP 122/88 | HR 79 | Temp 98.4°F | Ht 73.0 in | Wt 246.0 lb

## 2024-06-13 DIAGNOSIS — Z1159 Encounter for screening for other viral diseases: Secondary | ICD-10-CM | POA: Diagnosis not present

## 2024-06-13 DIAGNOSIS — Z23 Encounter for immunization: Secondary | ICD-10-CM | POA: Diagnosis not present

## 2024-06-13 DIAGNOSIS — Z0000 Encounter for general adult medical examination without abnormal findings: Secondary | ICD-10-CM | POA: Diagnosis not present

## 2024-06-13 DIAGNOSIS — E559 Vitamin D deficiency, unspecified: Secondary | ICD-10-CM

## 2024-06-13 DIAGNOSIS — Z113 Encounter for screening for infections with a predominantly sexual mode of transmission: Secondary | ICD-10-CM

## 2024-06-13 DIAGNOSIS — Z114 Encounter for screening for human immunodeficiency virus [HIV]: Secondary | ICD-10-CM | POA: Diagnosis not present

## 2024-06-13 DIAGNOSIS — D508 Other iron deficiency anemias: Secondary | ICD-10-CM

## 2024-06-13 NOTE — Progress Notes (Signed)
 Date:  06/13/2024   Name:  Randall Banks   DOB:  22-Nov-1991   MRN:  [id number]   Chief Complaint: Annual Exam  HPI Randall Banks returns today for routine physical after establishing care with our clinic 05/14/2024. Left elbow pain discussed last month has improved, but still clicking which is bothersome to him.   Last Physical: 10+ years ago Last Dental Exam: 1y ago Last Eye Exam: 1y ago Immunizations Due: Gardasil, Tdap  Medication list has been reviewed and updated.  Current Meds  Medication Sig   diclofenac  Sodium (VOLTAREN ) 1 % GEL Apply 2 g topically 4 (four) times daily. Use on affected joint up to 4x/day as needed. 2 grams is roughly 4 fingertips' worth of gel.   ketoconazole  (NIZORAL ) 2 % shampoo Apply 1 Application topically 2 (two) times a week.   meloxicam  (MOBIC ) 15 MG tablet Take 1 tablet (15 mg total) by mouth daily. Take with food. Do not take with other NSAIDs (ibuprofen , naproxen, aspirin).     Review of Systems  Patient Active Problem List   Diagnosis Date Noted   Iron deficiency anemia 05/14/2024   Left elbow pain 05/14/2024   Tinea versicolor 08/03/2022   Dermatofibroma 08/03/2022   Hyperlipidemia 07/18/2022   Prediabetes 07/18/2022   Vitamin D deficiency 07/18/2022   Dietary iron deficiency without anemia 07/18/2022    Allergies  Allergen Reactions   Shellfish Allergy Anaphylaxis, Dermatitis, Hives, Itching, Shortness Of Breath and Swelling     There is no immunization history on file for this patient.  Past Surgical History:  Procedure Laterality Date   EYE SURGERY     LASIK    Social History   Tobacco Use   Smoking status: Never   Smokeless tobacco: Never  Vaping Use   Vaping status: Never Used  Substance Use Topics   Alcohol use: Yes    Comment: socially   Drug use: No    Family History  Problem Relation Age of Onset   Asthma Mother    Diabetes Maternal Grandmother         06/13/2024    1:14 PM 05/14/2024    2:16 PM  GAD 7 :  Generalized Anxiety Score  Nervous, Anxious, on Edge 0 0  Control/stop worrying 0 0  Worry too much - different things 0 0  Trouble relaxing 1 0  Restless 0 0  Easily annoyed or irritable 1 0  Afraid - awful might happen 0 0  Total GAD 7 Score 2 0  Anxiety Difficulty Not difficult at all Not difficult at all       06/13/2024    1:14 PM 05/14/2024    2:16 PM  Depression screen PHQ 2/9  Decreased Interest 0 0  Down, Depressed, Hopeless 0 0  PHQ - 2 Score 0 0    BP Readings from Last 3 Encounters:  06/13/24 122/88  05/14/24 118/88  09/28/22 135/87    Wt Readings from Last 3 Encounters:  06/13/24 246 lb (111.6 kg)  07/25/15 220 lb (99.8 kg)  03/21/15 222 lb (100.7 kg)    BP 122/88   Pulse 79   Temp 98.4 F (36.9 C)   Ht 6' 1 (1.854 m)   Wt 246 lb (111.6 kg)   SpO2 98%   BMI 32.46 kg/m   Physical Exam Vitals and nursing note reviewed.  Constitutional:      Appearance: Normal appearance.  HENT:     Ears:     Comments: EAC clear bilaterally  with good view of TM which is without effusion or erythema.     Nose: Nose normal.     Mouth/Throat:     Mouth: Mucous membranes are moist. No oral lesions.     Dentition: Normal dentition.     Pharynx: No posterior oropharyngeal erythema.  Eyes:     Extraocular Movements: Extraocular movements intact.     Conjunctiva/sclera: Conjunctivae normal.     Pupils: Pupils are equal, round, and reactive to light.  Neck:     Thyroid: No thyromegaly.  Cardiovascular:     Rate and Rhythm: Normal rate and regular rhythm.     Heart sounds: No murmur heard.    No friction rub. No gallop.     Comments: Pulses 2+ at radial, PT, DP bilaterally. No carotid bruit. No peripheral edema Pulmonary:     Effort: Pulmonary effort is normal.     Breath sounds: Normal breath sounds.  Abdominal:     General: Bowel sounds are normal.     Palpations: Abdomen is soft. There is no mass.     Tenderness: There is no abdominal tenderness.   Genitourinary:    Penis: Normal and circumcised.      Testes: Normal.        Right: Mass or tenderness not present.        Left: Mass or tenderness not present.     Epididymis:     Right: Normal.     Left: Normal.     Comments: Asymmetry of testicular size R>L but within normal limits; advised home monitoring for changes. Self-exam technique reviewed.  Musculoskeletal:     Comments: Full ROM with strength 5/5 bilateral upper and lower extremities  Lymphadenopathy:     Cervical: No cervical adenopathy.  Skin:    General: Skin is warm.     Capillary Refill: Capillary refill takes less than 2 seconds.     Findings: No lesion or rash.  Neurological:     Mental Status: He is alert and oriented to person, place, and time.     Gait: Gait is intact.  Psychiatric:        Mood and Affect: Mood normal.        Behavior: Behavior normal.     Recent Labs     Component Value Date/Time   NA 136 07/21/2015 2151   K 3.6 07/21/2015 2151   CL 104 07/21/2015 2151   CO2 24 07/21/2015 2151   GLUCOSE 110 (H) 07/21/2015 2151   BUN 11 07/21/2015 2151   CREATININE 1.21 07/21/2015 2151   CALCIUM 9.2 07/21/2015 2151   PROT 6.9 07/21/2015 2151   ALBUMIN 3.6 07/21/2015 2151   AST 30 07/21/2015 2151   ALT 24 07/21/2015 2151   ALKPHOS 89 07/21/2015 2151   BILITOT 0.7 07/21/2015 2151   GFRNONAA >60 07/21/2015 2151   GFRAA >60 07/21/2015 2151    Lab Results  Component Value Date   WBC 8.3 07/21/2015   HGB 12.7 (L) 07/21/2015   HCT 38.3 (L) 07/21/2015   MCV 72.1 (L) 07/21/2015   PLT 186 07/21/2015   No results found for: HGBA1C No results found for: CHOL, HDL, LDLCALC, LDLDIRECT, TRIG, CHOLHDL No results found for: TSH   Assessment and Plan:  1. Annual physical exam (Primary) Encouraged healthy lifestyle including regular physical activity and consumption of whole fruits and vegetables. Encouraged routine dental and eye exams.   - CBC with Differential/Platelet -  Comprehensive metabolic panel with GFR - TSH  2. Iron  deficiency anemia secondary to inadequate dietary iron intake - Iron, TIBC and Ferritin Panel  3. Vitamin D deficiency - VITAMIN D 25 Hydroxy (Vit-D Deficiency, Fractures)  4. Need for hepatitis C screening test  5. Screening for HIV (human immunodeficiency virus)  6. Screening examination for STI - GC/Chlamydia Probe Amp - RPR - HIV Antibody (routine testing w rflx) - Hepatitis B surface antibody,qualitative - Hepatitis B surface antigen - Hepatitis B core antibody, total - Hepatitis C antibody  7. Encounter for immunization - Tdap vaccine greater than or equal to 7yo IM  8. Need for HPV vaccination Gardasil dose #1 administered today.  - HPV 9-valent vaccine,Recombinat   Nurse visits for Gardasil in 2 months and 6 months from now.  Return in about 1 year (around 06/13/2025) for CPE.    Rolan Hoyle, PA-C, DMSc, Nutritionist [facility] Primary Care and Sports Medicine [facility] [phone]

## 2024-06-13 NOTE — Patient Instructions (Signed)

## 2024-06-14 LAB — CBC WITH DIFFERENTIAL/PLATELET
Basophils Absolute: 0.1 x10E3/uL (ref 0.0–0.2)
Basos: 1 %
EOS (ABSOLUTE): 0.3 x10E3/uL (ref 0.0–0.4)
Eos: 3 %
Hematocrit: 45.9 % (ref 37.5–51.0)
Hemoglobin: 13.9 g/dL (ref 13.0–17.7)
Immature Grans (Abs): 0 x10E3/uL (ref 0.0–0.1)
Immature Granulocytes: 0 %
Lymphocytes Absolute: 2 x10E3/uL (ref 0.7–3.1)
Lymphs: 22 %
MCH: 23.1 pg — ABNORMAL LOW (ref 26.6–33.0)
MCHC: 30.3 g/dL — ABNORMAL LOW (ref 31.5–35.7)
MCV: 76 fL — ABNORMAL LOW (ref 79–97)
Monocytes Absolute: 0.5 x10E3/uL (ref 0.1–0.9)
Monocytes: 5 %
Neutrophils Absolute: 6.1 x10E3/uL (ref 1.4–7.0)
Neutrophils: 69 %
Platelets: 227 x10E3/uL (ref 150–450)
RBC: 6.01 x10E6/uL — ABNORMAL HIGH (ref 4.14–5.80)
RDW: 14.8 % (ref 11.6–15.4)
WBC: 8.9 x10E3/uL (ref 3.4–10.8)

## 2024-06-14 LAB — HIV ANTIBODY (ROUTINE TESTING W REFLEX): HIV Screen 4th Generation wRfx: NONREACTIVE

## 2024-06-14 LAB — IRON,TIBC AND FERRITIN PANEL
Ferritin: 174 ng/mL (ref 30–400)
Iron Saturation: 25 % (ref 15–55)
Iron: 81 ug/dL (ref 38–169)
Total Iron Binding Capacity: 320 ug/dL (ref 250–450)
UIBC: 239 ug/dL (ref 111–343)

## 2024-06-14 LAB — COMPREHENSIVE METABOLIC PANEL WITH GFR
ALT: 51 IU/L — ABNORMAL HIGH (ref 0–44)
AST: 252 IU/L — ABNORMAL HIGH (ref 0–40)
Albumin: 4.7 g/dL (ref 4.1–5.1)
Alkaline Phosphatase: 104 IU/L (ref 44–121)
BUN/Creatinine Ratio: 11 (ref 9–20)
BUN: 16 mg/dL (ref 6–20)
Bilirubin Total: 0.5 mg/dL (ref 0.0–1.2)
CO2: 23 mmol/L (ref 20–29)
Calcium: 9.7 mg/dL (ref 8.7–10.2)
Chloride: 99 mmol/L (ref 96–106)
Creatinine, Ser: 1.43 mg/dL — ABNORMAL HIGH (ref 0.76–1.27)
Globulin, Total: 2.9 g/dL (ref 1.5–4.5)
Glucose: 83 mg/dL (ref 70–99)
Potassium: 4.2 mmol/L (ref 3.5–5.2)
Sodium: 137 mmol/L (ref 134–144)
Total Protein: 7.6 g/dL (ref 6.0–8.5)
eGFR: 66 mL/min/1.73 (ref >59)

## 2024-06-14 LAB — HEPATITIS B SURFACE ANTIGEN: Hepatitis B Surface Ag: NEGATIVE

## 2024-06-14 LAB — HEPATITIS B CORE ANTIBODY, TOTAL: Hep B Core Total Ab: NEGATIVE

## 2024-06-14 LAB — TSH: TSH: 2.02 u[IU]/mL (ref 0.450–4.500)

## 2024-06-14 LAB — RPR: RPR Ser Ql: NONREACTIVE

## 2024-06-14 LAB — VITAMIN D 25 HYDROXY (VIT D DEFICIENCY, FRACTURES): Vit D, 25-Hydroxy: 20 ng/mL — ABNORMAL LOW (ref 30.0–100.0)

## 2024-06-14 LAB — HEPATITIS C ANTIBODY: Hep C Virus Ab: NONREACTIVE

## 2024-06-14 LAB — HEPATITIS B SURFACE ANTIBODY,QUALITATIVE: Hep B Surface Ab, Qual: NONREACTIVE

## 2024-06-16 ENCOUNTER — Telehealth: Payer: Self-pay

## 2024-06-16 ENCOUNTER — Ambulatory Visit: Payer: Self-pay | Admitting: Physician Assistant

## 2024-06-16 LAB — GC/CHLAMYDIA PROBE AMP
Chlamydia trachomatis, NAA: NEGATIVE
Neisseria Gonorrhoeae by PCR: NEGATIVE

## 2024-06-16 NOTE — Telephone Encounter (Signed)
-----   Message from Rolan SHAUNNA Hoyle sent at 06/16/2024  1:26 PM EDT ----- Regarding: Hep B Booster Patient has appointments in Sept and Jan for nurse visits (Gardasil). We will also plan to give booster for Hep B at the Sept visit, with Hep B dose 2 planned for Oct/Nov (which will need to be scheduled at his next nurse visit). Please add to any existing nurse visit notes to help facilitate this. Thanks!

## 2024-06-16 NOTE — Telephone Encounter (Signed)
 Noted  KP

## 2024-06-30 ENCOUNTER — Other Ambulatory Visit: Payer: Self-pay | Admitting: Physician Assistant

## 2024-06-30 DIAGNOSIS — R718 Other abnormality of red blood cells: Secondary | ICD-10-CM

## 2024-06-30 DIAGNOSIS — R7401 Elevation of levels of liver transaminase levels: Secondary | ICD-10-CM

## 2024-07-04 DIAGNOSIS — R7401 Elevation of levels of liver transaminase levels: Secondary | ICD-10-CM | POA: Diagnosis not present

## 2024-07-04 DIAGNOSIS — R718 Other abnormality of red blood cells: Secondary | ICD-10-CM | POA: Diagnosis not present

## 2024-07-09 ENCOUNTER — Ambulatory Visit: Payer: Self-pay | Admitting: Physician Assistant

## 2024-07-09 DIAGNOSIS — D563 Thalassemia minor: Secondary | ICD-10-CM

## 2024-07-09 LAB — COMPREHENSIVE METABOLIC PANEL WITH GFR
ALT: 19 IU/L (ref 0–44)
AST: 20 IU/L (ref 0–40)
Albumin: 4.3 g/dL (ref 4.1–5.1)
Alkaline Phosphatase: 98 IU/L (ref 44–121)
BUN/Creatinine Ratio: 10 (ref 9–20)
BUN: 13 mg/dL (ref 6–20)
Bilirubin Total: 0.3 mg/dL (ref 0.0–1.2)
CO2: 21 mmol/L (ref 20–29)
Calcium: 9.4 mg/dL (ref 8.7–10.2)
Chloride: 102 mmol/L (ref 96–106)
Creatinine, Ser: 1.3 mg/dL — ABNORMAL HIGH (ref 0.76–1.27)
Globulin, Total: 2.6 g/dL (ref 1.5–4.5)
Glucose: 83 mg/dL (ref 70–99)
Potassium: 4.8 mmol/L (ref 3.5–5.2)
Sodium: 138 mmol/L (ref 134–144)
Total Protein: 6.9 g/dL (ref 6.0–8.5)
eGFR: 74 mL/min/1.73 (ref >59)

## 2024-07-09 LAB — HGB FRACTIONATION CASCADE
Hgb A2: 2.3 % (ref 1.8–3.2)
Hgb A: 97.7 % (ref 96.4–98.8)
Hgb F: 0 % (ref 0.0–2.0)
Hgb S: 0 %

## 2024-07-22 LAB — ALPHA-THALASSEMIA ANALYSIS: IMAGE: 0

## 2024-07-22 LAB — SPECIMEN STATUS REPORT

## 2024-07-23 DIAGNOSIS — D563 Thalassemia minor: Secondary | ICD-10-CM | POA: Insufficient documentation

## 2024-08-12 ENCOUNTER — Encounter: Payer: Self-pay | Admitting: Physician Assistant

## 2024-08-12 ENCOUNTER — Ambulatory Visit: Admitting: Physician Assistant

## 2024-08-12 VITALS — BP 124/88 | HR 72 | Temp 98.5°F | Ht 73.0 in | Wt 251.0 lb

## 2024-08-12 DIAGNOSIS — B001 Herpesviral vesicular dermatitis: Secondary | ICD-10-CM

## 2024-08-12 MED ORDER — VALACYCLOVIR HCL 1 G PO TABS: 2000.0000 mg | ORAL_TABLET | Freq: Two times a day (BID) | ORAL | 0 refills | Status: AC

## 2024-08-12 NOTE — Progress Notes (Signed)
 Date:  08/12/2024   Name:  Randall Banks   DOB:  [birth year]   MRN:  [id number]   Chief Complaint: Mouth Lesions (X1 day, unchanged,lips, has happened on time before when being in the sun, never been prescribed medication )  HPI  Randall Banks presents with a cold sore of upper lip since yesterday. This has happened before but he has never taken medication for it. It is mildly tender.     Medication list has been reviewed and updated.  Current Meds  Medication Sig   diclofenac  Sodium (VOLTAREN ) 1 % GEL Apply 2 g topically 4 (four) times daily. Use on affected joint up to 4x/day as needed. 2 grams is roughly 4 fingertips' worth of gel.   ketoconazole  (NIZORAL ) 2 % shampoo Apply 1 Application topically 2 (two) times a week.   meloxicam  (MOBIC ) 15 MG tablet Take 1 tablet (15 mg total) by mouth daily. Take with food. Do not take with other NSAIDs (ibuprofen , naproxen, aspirin).   valACYclovir  (VALTREX ) 1000 MG tablet Take 2 tablets (2,000 mg total) by mouth 2 (two) times daily. Treat for 24h (2 tablets every 12h for a day) at the onset of a cold sore.     Review of Systems  Patient Active Problem List   Diagnosis Date Noted   Alpha thalassemia trait 07/23/2024   Iron deficiency anemia 05/14/2024   Left elbow pain 05/14/2024   Tinea versicolor 08/03/2022   Dermatofibroma 08/03/2022   Hyperlipidemia 07/18/2022   Prediabetes 07/18/2022   Vitamin D  deficiency 07/18/2022   Dietary iron deficiency without anemia 07/18/2022    Allergies  Allergen Reactions   Shellfish Allergy Anaphylaxis, Dermatitis, Hives, Itching, Shortness Of Breath and Swelling    Immunization History  Administered Date(s) Administered   HPV 9-valent 06/13/2024   Tdap 06/13/2024    Past Surgical History:  Procedure Laterality Date   EYE SURGERY     LASIK    Social History   Tobacco Use   Smoking status: Never   Smokeless tobacco: Never  Vaping Use   Vaping status: Never Used  Substance Use Topics    Alcohol use: Yes    Comment: socially   Drug use: No    Family History  Problem Relation Age of Onset   Asthma Mother    Diabetes Maternal Grandmother         08/12/2024    9:00 AM 06/13/2024    1:14 PM 05/14/2024    2:16 PM  GAD 7 : Generalized Anxiety Score  Nervous, Anxious, on Edge 0 0 0  Control/stop worrying 0 0 0  Worry too much - different things  0 0  Trouble relaxing 0 1 0  Restless 0 0 0  Easily annoyed or irritable 0 1 0  Afraid - awful might happen 0 0 0  Total GAD 7 Score  2 0  Anxiety Difficulty Not difficult at all Not difficult at all Not difficult at all       08/12/2024    9:00 AM 06/13/2024    1:14 PM 05/14/2024    2:16 PM  Depression screen PHQ 2/9  Decreased Interest 0 0 0  Down, Depressed, Hopeless 0 0 0  PHQ - 2 Score 0 0 0    BP Readings from Last 3 Encounters:  08/12/24 124/88  06/13/24 122/88  05/14/24 118/88    Wt Readings from Last 3 Encounters:  08/12/24 251 lb (113.9 kg)  06/13/24 246 lb (111.6 kg)  07/25/15 220 lb (99.8  kg)    BP 124/88   Pulse 72   Temp 98.5 F (36.9 C)   Ht 6' 1 (1.854 m)   Wt 251 lb (113.9 kg)   SpO2 96%   BMI 33.12 kg/m   Physical Exam Vitals and nursing note reviewed.  Constitutional:      Appearance: Normal appearance.  HENT:     Mouth/Throat:     Lips: Lesions (ulcerated papule of upper lip just right of midline) present.  Cardiovascular:     Rate and Rhythm: Normal rate.  Pulmonary:     Effort: Pulmonary effort is normal.  Abdominal:     General: There is no distension.  Musculoskeletal:        General: Normal range of motion.  Skin:    General: Skin is warm and dry.  Neurological:     Mental Status: He is alert and oriented to person, place, and time.     Gait: Gait is intact.  Psychiatric:        Mood and Affect: Mood and affect normal.     Recent Labs     Component Value Date/Time   NA 138 07/04/2024 0706   K 4.8 07/04/2024 0706   CL 102 07/04/2024 0706   CO2 21  07/04/2024 0706   GLUCOSE 83 07/04/2024 0706   GLUCOSE 110 (H) 07/21/2015 2151   BUN 13 07/04/2024 0706   CREATININE 1.30 (H) 07/04/2024 0706   CALCIUM 9.4 07/04/2024 0706   PROT 6.9 07/04/2024 0706   ALBUMIN 4.3 07/04/2024 0706   AST 20 07/04/2024 0706   ALT 19 07/04/2024 0706   ALKPHOS 98 07/04/2024 0706   BILITOT 0.3 07/04/2024 0706   GFRNONAA >60 07/21/2015 2151   GFRAA >60 07/21/2015 2151    Lab Results  Component Value Date   WBC 8.9 06/13/2024   HGB 13.9 06/13/2024   HCT 45.9 06/13/2024   MCV 76 (L) 06/13/2024   PLT 227 06/13/2024   No results found for: HGBA1C No results found for: CHOL, HDL, LDLCALC, LDLDIRECT, TRIG, CHOLHDL Lab Results  Component Value Date   TSH 2.020 06/13/2024      Assessment and Plan:  Cold sore -     valACYclovir  HCl; Take 2 tablets (2,000 mg total) by mouth 2 (two) times daily. Treat for 24h (2 tablets every 12h for a day) at the onset of a cold sore.  Dispense: 20 tablet; Refill: 0  Also encouraged topical, such as Abreva, to expedite treatment and symptomatic relief.      Rolan Hoyle, PA-C, DMSc, Nutritionist [facility] Primary Care and Sports Medicine [facility] [phone]

## 2024-08-14 ENCOUNTER — Ambulatory Visit (INDEPENDENT_AMBULATORY_CARE_PROVIDER_SITE_OTHER)

## 2024-08-14 DIAGNOSIS — Z23 Encounter for immunization: Secondary | ICD-10-CM | POA: Diagnosis not present

## 2024-08-14 NOTE — Progress Notes (Signed)
 Patient is in office today for a nurse visit for Immunization. Patient Injection was given in the  Left deltoid. Patient tolerated injection well. Right Deltiod as well.

## 2024-09-18 ENCOUNTER — Ambulatory Visit (INDEPENDENT_AMBULATORY_CARE_PROVIDER_SITE_OTHER)

## 2024-09-18 DIAGNOSIS — Z23 Encounter for immunization: Secondary | ICD-10-CM | POA: Diagnosis not present

## 2024-09-18 NOTE — Progress Notes (Signed)
 Patient is in office today for a nurse visit for Immunization. Patient Injection was given in the  Left deltoid. Patient tolerated injection well.

## 2024-11-14 ENCOUNTER — Ambulatory Visit: Admitting: Family Medicine

## 2024-11-14 ENCOUNTER — Ambulatory Visit: Payer: Self-pay

## 2024-11-14 ENCOUNTER — Telehealth: Admitting: Physician Assistant

## 2024-11-14 ENCOUNTER — Encounter: Payer: Self-pay | Admitting: Family Medicine

## 2024-11-14 VITALS — BP 120/78 | HR 97 | Temp 98.4°F | Ht 73.0 in | Wt 248.0 lb

## 2024-11-14 DIAGNOSIS — R197 Diarrhea, unspecified: Secondary | ICD-10-CM

## 2024-11-14 DIAGNOSIS — A084 Viral intestinal infection, unspecified: Secondary | ICD-10-CM

## 2024-11-14 NOTE — Progress Notes (Signed)
" °  Because of severity of symptoms and associated lightheadedness, I feel your condition warrants further evaluation and I recommend that you be seen in a face-to-face visit.   NOTE: There will be NO CHARGE for this E-Visit   If you are having a true medical emergency, please call 911.     For an urgent face to face visit, [facility] has multiple urgent care centers for your convenience.  Click the link below for the full list of locations and hours, walk-in wait times, appointment scheduling options and driving directions:  Urgent Care - [place], [place], [place], [place], [place], [place]       Your MyChart E-visit questionnaire answers were reviewed by a board certified advanced clinical practitioner to complete your personal care plan based on your specific symptoms.    Thank you for using e-Visits.    "

## 2024-11-14 NOTE — Telephone Encounter (Signed)
 FYI Only or Action Required?: FYI only for provider: appointment scheduled on 11/14/2024.  Patient was last seen in primary care on 08/12/2024 by Manya Toribio SQUIBB, PA.  Called Nurse Triage reporting Diarrhea.  Symptoms began several days ago.  Symptoms are: gradually improving.  Triage Disposition: See HCP Within 4 Hours (Or PCP Triage)  Patient/caregiver understands and will follow disposition?: Yes          Copied from CRM #[id number]. Topic: Appointments - Appointment Scheduling >> Nov 14, 2024  9:18 AM Tonda B wrote: Patient/patient representative is calling to schedule an appointment. Refer to attachments for appointment information.  Patient has been having DIARRHEA  for 5 days       Reason for Disposition  [1] SEVERE diarrhea (e.g., 7 or more times / day more than normal) AND [2] age > 60 years  Answer Assessment - Initial Assessment Questions Diarrhea since Sun  Past 24 hours- around 8 or a little more Intermittent stomach pain Pt states he had a fever and chills the first 24 hours but has gone now  Denies: blood in stool, dark/tarry; recent antibiotic use  Protocols used: [facility]

## 2024-11-14 NOTE — Progress Notes (Signed)
 "  Acute Office Visit  Subjective:     Patient ID: Randall Banks, male    DOB: 1990-12-16, 33 y.o.   MRN: [id number]  Chief Complaint  Patient presents with   Diarrhea    Been having diarrhea since Sunday. On Sunday he had a low grade fever but did not take his temperature, he just felt hot and had chills. Per patient has had at least 5 BM since this morning.     HPI Patient is in today for diarrhea. Discussed the use of AI scribe software for clinical note transcription with the patient, who gave verbal consent to proceed.  History of Present Illness Randall Banks is a 33 year old male who presents with persistent diarrhea and abdominal cramps.  Since Sunday, December 14, he has experienced persistent diarrhea, occurring six to eight times daily. Initially, he had chills and felt very cold, and these symptoms resolved after 24 hours. The diarrhea has continued without improvement, with episodes occurring as frequently as five times since 2 AM today.  He reports associated abdominal cramps that precede the need to use the bathroom. He also experiences lightheadedness and a feeling of dehydration, which improves temporarily with electrolyte drinks and food intake, but the diarrhea persists. No blood, mucus, or black tarry stools are noted in his bowel movements. No vomiting is present. The stools are described as clear and reflective of his recent dietary intake.  He recently started taking various vitamins and supplements, including creatine daily, and glutamine with pre-workout sessions. He stopped these supplements on Sunday, suspecting they might be contributing to his symptoms.  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 He initially suspected food poisoning after eating a chicken filling from [employer] on Sunday morning, which coincided with the onset of his symptoms by Sunday night.    Review of Systems  All other systems  reviewed and are negative.       Objective:    BP 120/78   Pulse 97   Temp 98.4 F (36.9 C) (Oral)   Ht 6' 1 (1.854 m)   Wt 248 lb (112.5 kg)   SpO2 98%   BMI 32.72 kg/m     Physical Exam Vitals and nursing note reviewed.  Constitutional:      Appearance: Normal appearance.  HENT:     Head: Normocephalic.     Right Ear: External ear normal.     Left Ear: External ear normal.  Eyes:     Conjunctiva/sclera: Conjunctivae normal.  Cardiovascular:     Rate and Rhythm: Normal rate.  Pulmonary:     Effort: Pulmonary effort is normal. No respiratory distress.  Abdominal:     General: Bowel sounds are normal. There is no distension.     Palpations: Abdomen is soft.     Tenderness: There is abdominal tenderness.  Musculoskeletal:        General: Normal range of motion.  Skin:    General: Skin is warm.  Neurological:     Mental Status: He is alert and oriented to person, place, and time.  Psychiatric:        Mood and Affect: Mood normal.     No results found for any visits on 11/14/24.      Assessment & Plan:   Problem List Items Addressed This Visit   None Visit Diagnoses       Diarrhea, unspecified type    -  Primary   Relevant Orders  Cdiff NAA+O+P+Stool Culture      Assessment and Plan Assessment & Plan Acute diarrhea Diarrhea persisting for six days with abdominal cramps and lightheadedness. No blood, vomiting, or mucus. Resolved fever and chills. Likely viral gastroenteritis or dietary cause. - Continue hydration with electrolytes. - Use Pepto Bismol for discomfort. - Adopt bland diet: bananas, rice, applesauce, toast. - Avoid meat and high-fat foods. - Consider Imodium  for diarrhea control. - Ordered stool culture kit if symptoms persist or worsen. - Monitor for fever, abdominal pain, or blood in stool. - Follow up if no improvement over weekend or new symptoms develop.   No orders of the defined types were placed in this encounter.   No  follow-ups on file.  Vinary K Giannah Zavadil, MD   "

## 2024-11-14 NOTE — Telephone Encounter (Unsigned)
 Copied from CRM #[id number]. Topic: Appointments - Appointment Scheduling >> Nov 14, 2024  9:18 AM Tonda B wrote: Patient/patient representative is calling to schedule an appointment. Refer to attachments for appointment information.  Patient has been having DIARRHEA  for 5 days

## 2024-12-15 ENCOUNTER — Ambulatory Visit

## 2024-12-15 VITALS — BP 118/84 | HR 92 | Ht 73.0 in | Wt 252.0 lb

## 2024-12-15 DIAGNOSIS — Z23 Encounter for immunization: Secondary | ICD-10-CM

## 2024-12-15 NOTE — Progress Notes (Unsigned)
 Adminstered Gardasil 9 injection in LD and patient tolerated well and without complaint.

## 2025-06-17 ENCOUNTER — Encounter: Admitting: Physician Assistant
